# Patient Record
Sex: Female | Born: 1960 | Race: White | Hispanic: No | Marital: Single | Smoking: Current every day smoker
Health system: Southern US, Community
[De-identification: ages and names within clinical notes are randomized; demographics above are authoritative.]

## PROBLEM LIST (undated history)

## (undated) DIAGNOSIS — I10 Essential (primary) hypertension: Secondary | ICD-10-CM

## (undated) HISTORY — PX: ABDOMINAL HYSTERECTOMY: SHX81

## (undated) HISTORY — DX: Essential (primary) hypertension: I10

---

## 2003-08-02 ENCOUNTER — Other Ambulatory Visit: Payer: Self-pay

## 2006-11-11 ENCOUNTER — Emergency Department: Payer: Self-pay | Admitting: Unknown Physician Specialty

## 2007-10-06 ENCOUNTER — Emergency Department: Payer: Self-pay | Admitting: Emergency Medicine

## 2007-10-06 ENCOUNTER — Other Ambulatory Visit: Payer: Self-pay

## 2007-12-18 ENCOUNTER — Emergency Department: Payer: Self-pay | Admitting: Emergency Medicine

## 2008-01-29 ENCOUNTER — Ambulatory Visit: Payer: Self-pay | Admitting: Neurology

## 2008-05-25 IMAGING — CT CT ABD-PELV W/O CM
1 of 2 series · 16 of 32 positions shown, 20 images · non-contrast
Comparison: none

REASON FOR EXAM: Abdominal pain
COMMENTS:

PROCEDURE:     CT  - CT ABDOMEN AND PELVIS W[DATE]  [DATE]
RESULT:     The patient has a history of abdominal pain.
TECHNIQUE: Nonenhanced CT scan of abdomen and pelvis is obtained.

[Series 2: stone · axial · 0.62mm/px · z∈[-292,+82]mm · 16 of 135 slices shown, 20 images]
[im 5/135  soft-tissue]
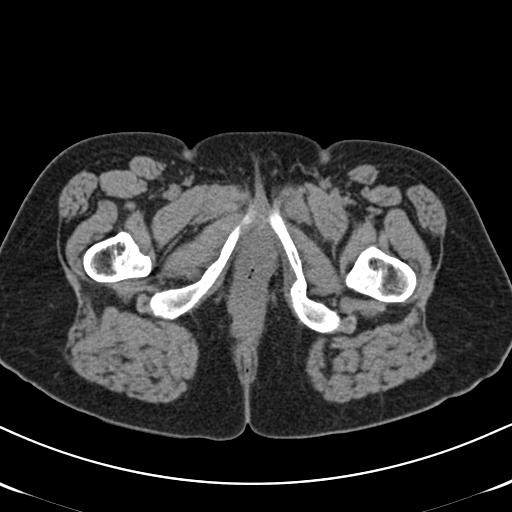
[im 5/135  bone]
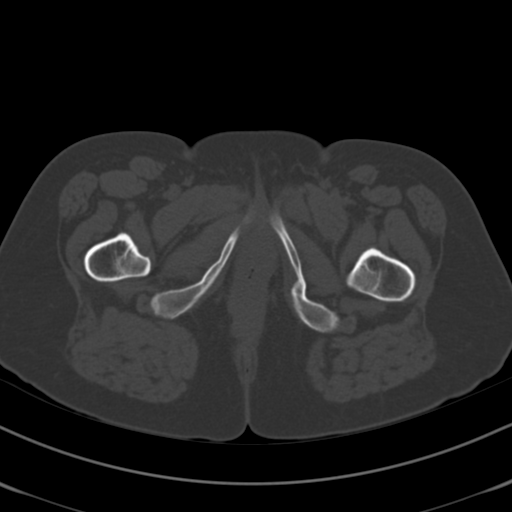
[im 15/135  soft-tissue]
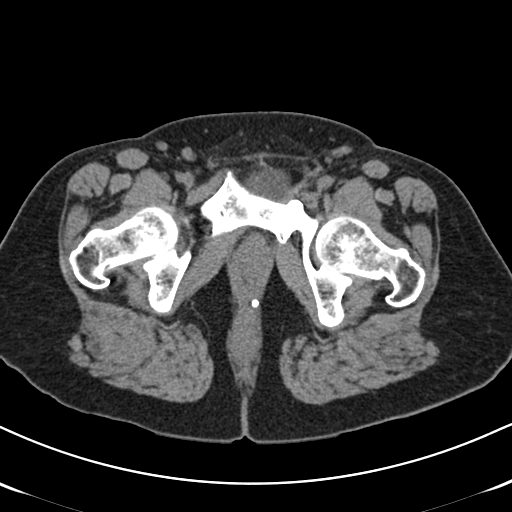
[im 24/135  soft-tissue]
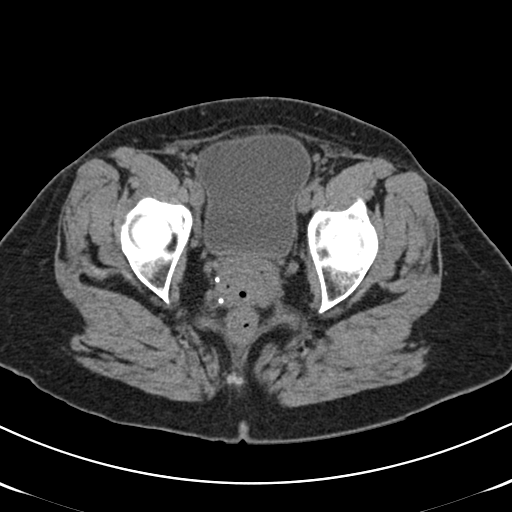
[im 34/135  soft-tissue]
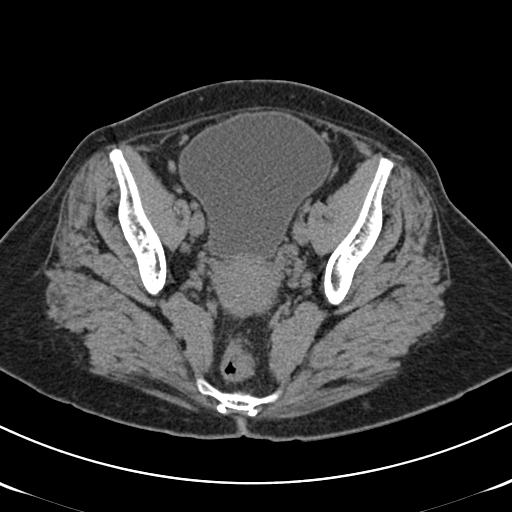
[im 44/135  soft-tissue]
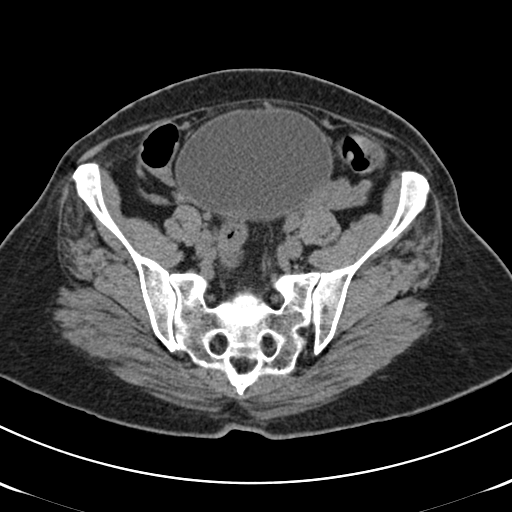
[im 53/135  soft-tissue]
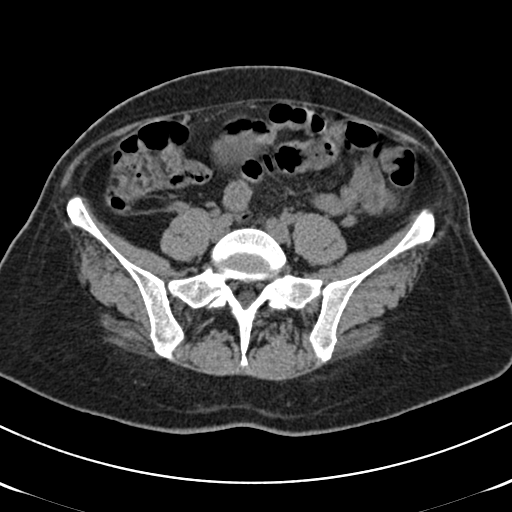
[im 63/135  soft-tissue]
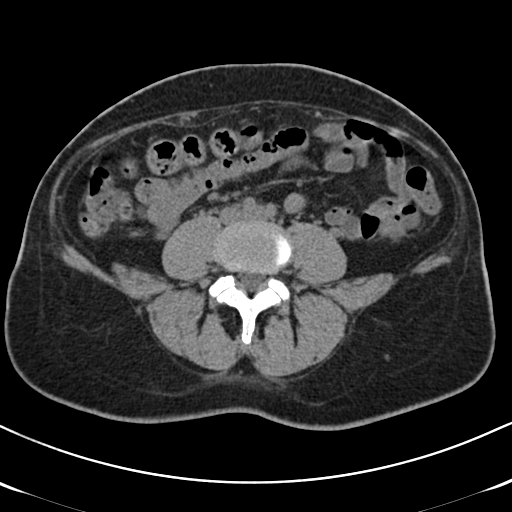
[im 72/135  soft-tissue]
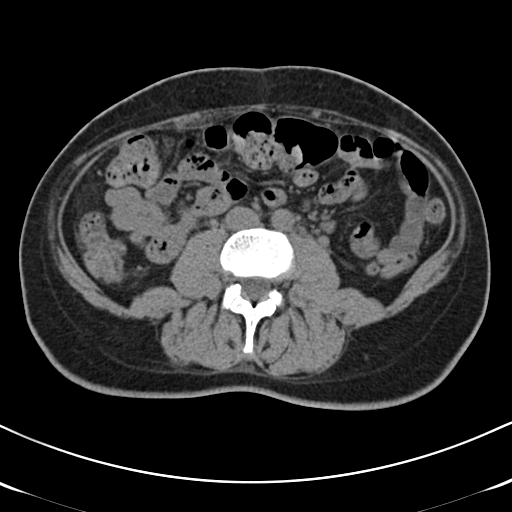
[im 82/135  soft-tissue]
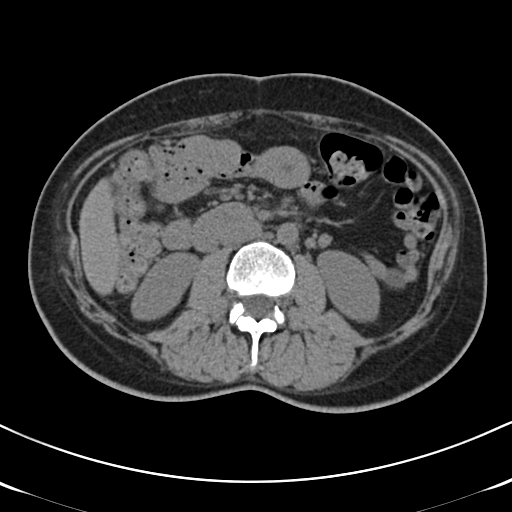
[im 82/135  bone]
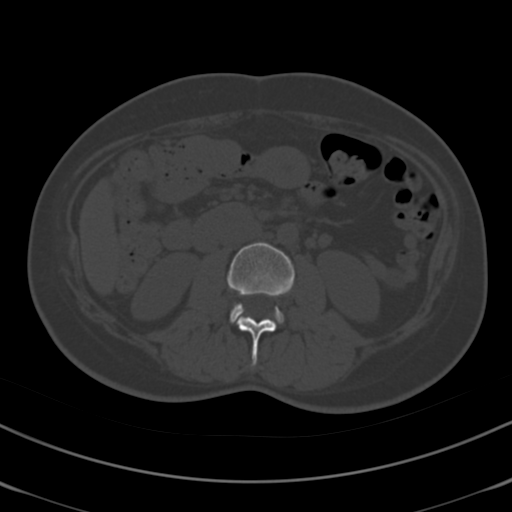
[im 91/135  soft-tissue]
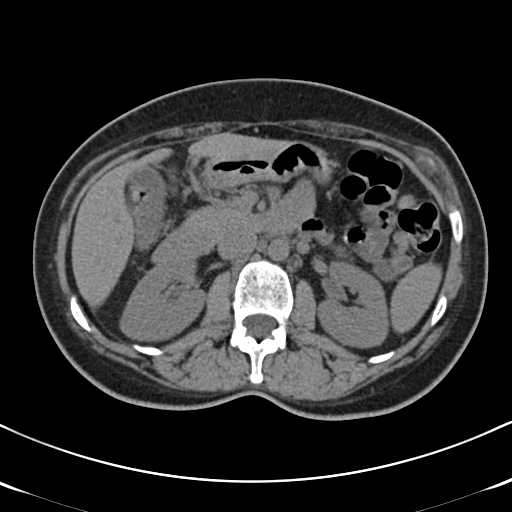
[im 101/135  soft-tissue]
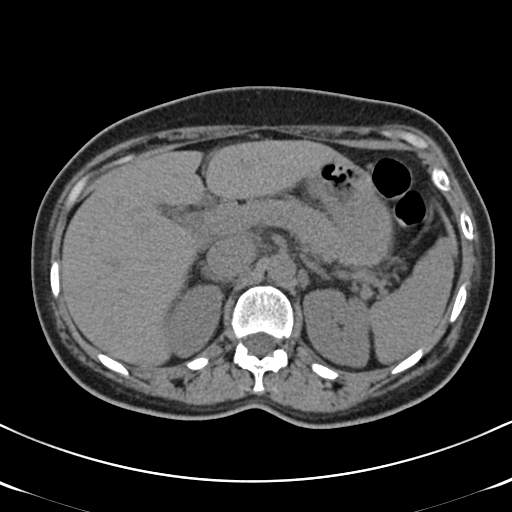
[im 111/135  soft-tissue]
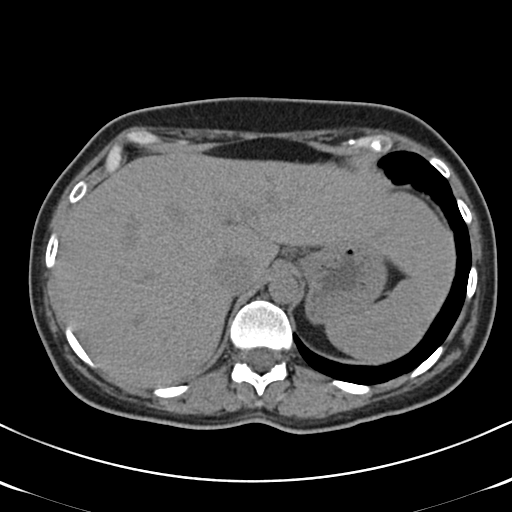
[im 115/135  lung]
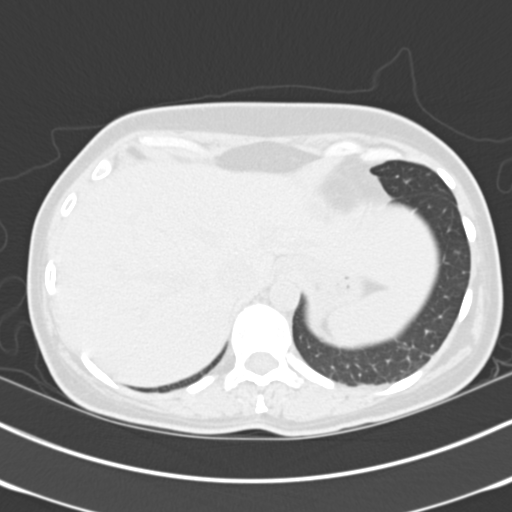
[im 120/135  soft-tissue]
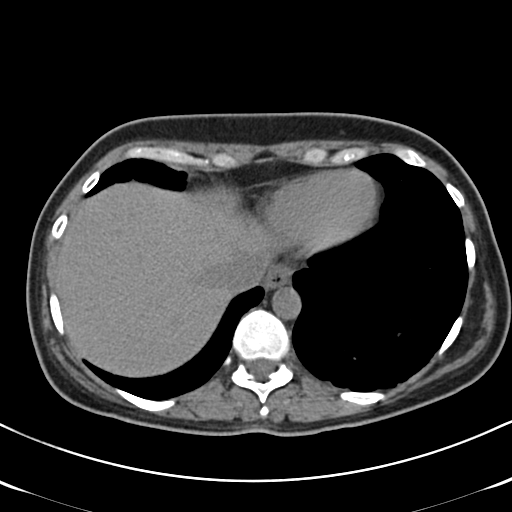
[im 120/135  lung]
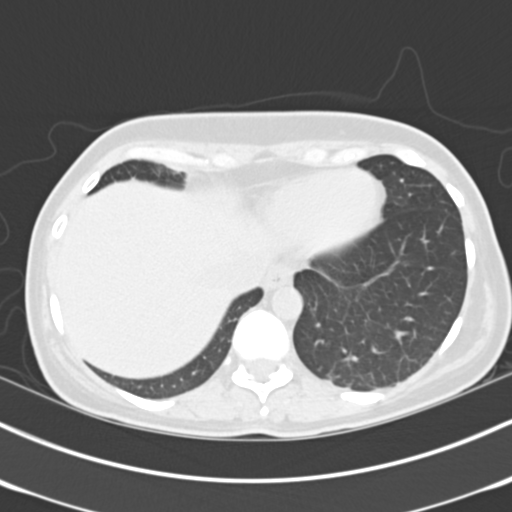
[im 125/135  lung]
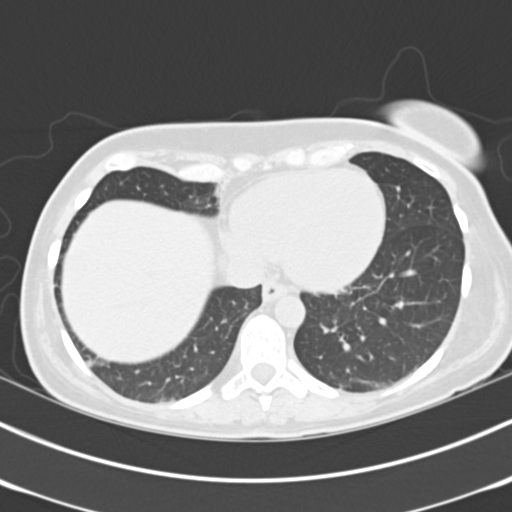
[im 130/135  soft-tissue]
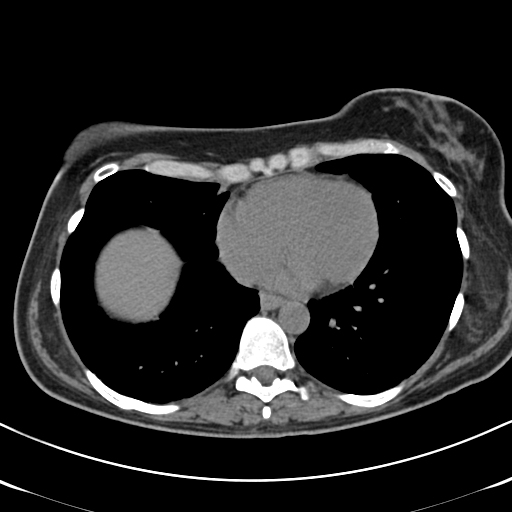
[im 130/135  lung]
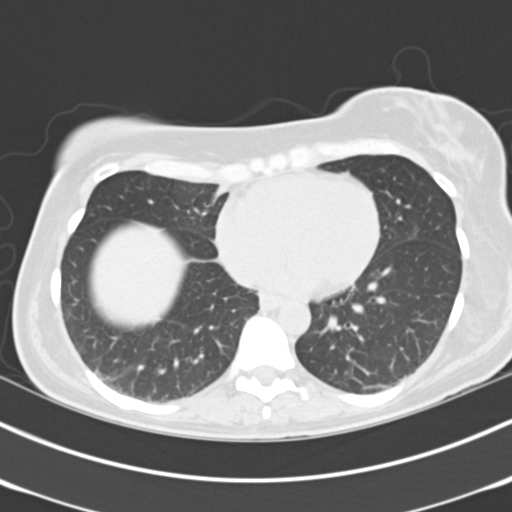

[16 of 32 positions shown; findings below may reference images not displayed]

FINDINGS: The liver and spleen are normal. The pancreas is normal. The
adrenals are normal. No focal renal abnormalities are identified. There is
no bowel distension. The appendix is normal. Small, inguinal lymph nodes are
noted. No pathologic pelvic fluid collections are identified. Deformity of
the pubic symphysis is noted consistent with prior trauma.
IMPRESSION: Nonspecific exam. Incidental note is made of shotty,
retroperitoneal lymph nodes. Also noted are small, inguinal lymph nodes.
Clinical correlation is suggested. If the patient has persistent symptoms,
then PET/CT can be performed to further evaluate these small lymph nodes.

## 2009-06-30 IMAGING — CT CT HEAD WITHOUT CONTRAST
2 series · 16 of 30 positions shown, 20 images · non-contrast
Comparison: none

REASON FOR EXAM: Headache and vertigo
COMMENTS:

PROCEDURE:     CT  - CT HEAD WITHOUT CONTRAST  - December 18, 2007  [DATE]
RESULT:     Comparison: No comparison
TECHNIQUE: Multiple axial images from the foramen magnum to the vertex were
obtained without IV contrast.

[Series 2: without · axial · non-contrast · 0.39mm/px · z∈[-158,-38]mm · 13 of 28 slices shown, 17 images]
[im 2/28  brain]
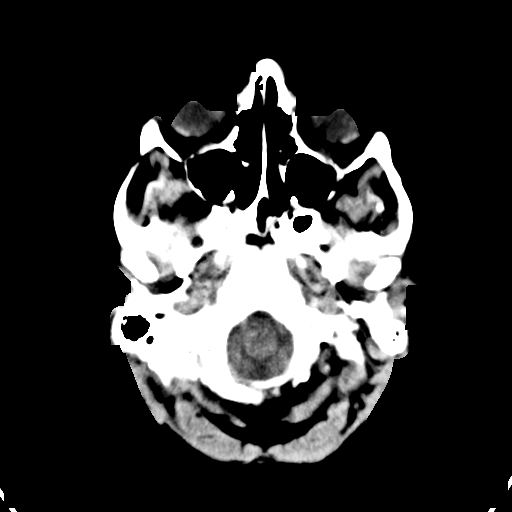
[im 2/28  bone]
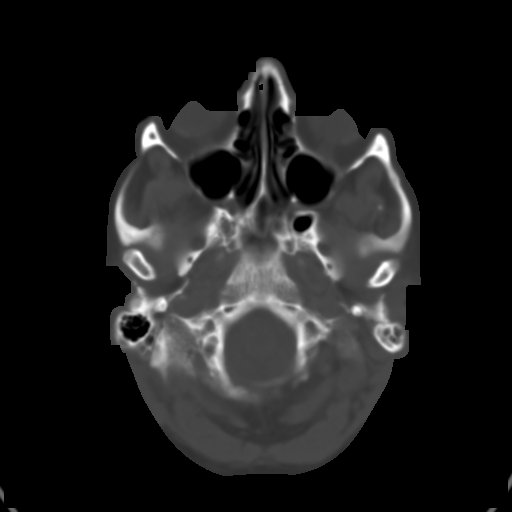
[im 4/28  brain]
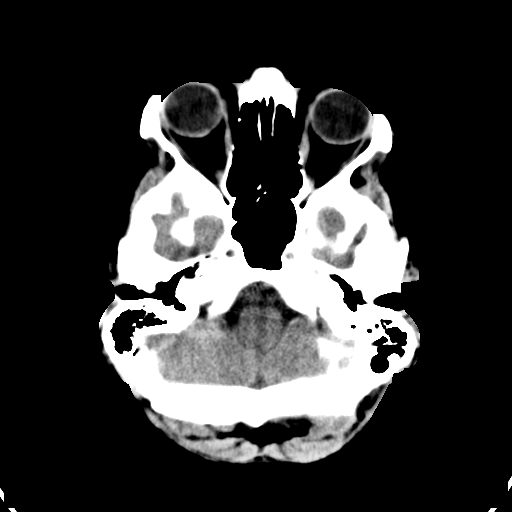
[im 6/28  brain]
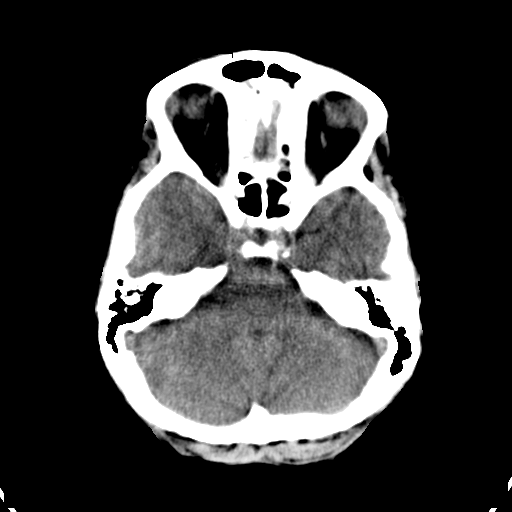
[im 8/28  brain]
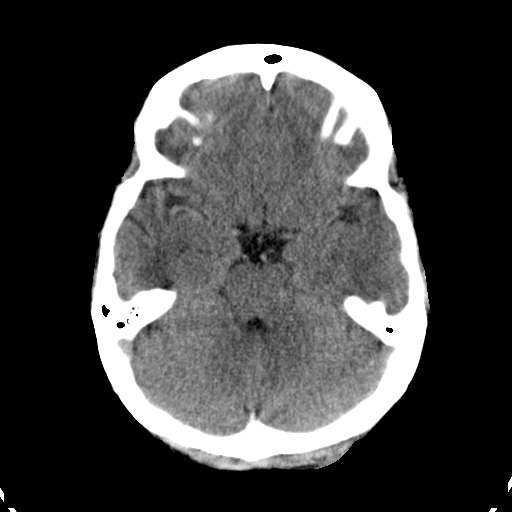
[im 10/28  brain]
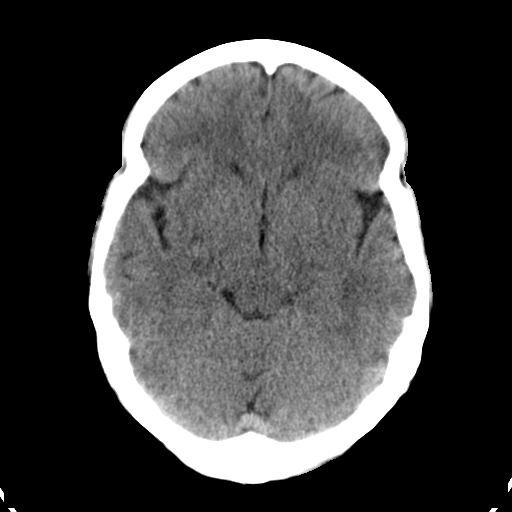
[im 10/28  bone]
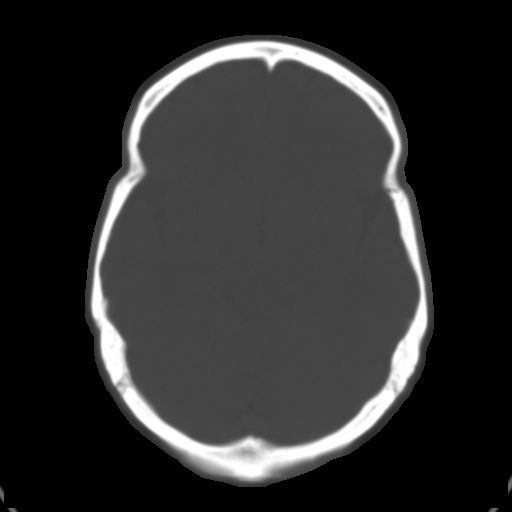
[im 12/28  brain]
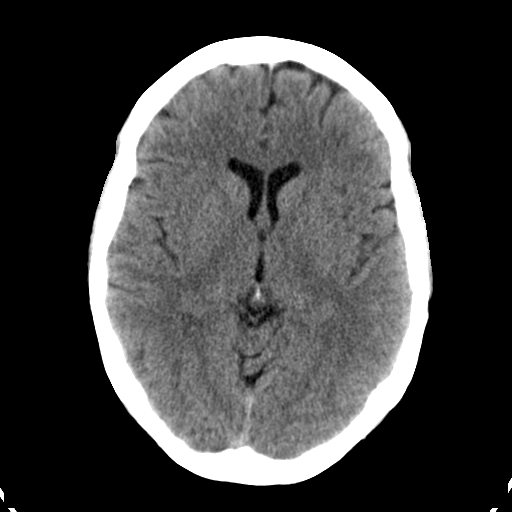
[im 14/28  brain]
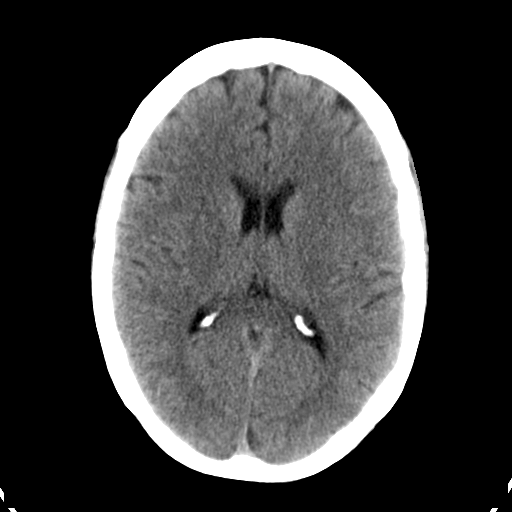
[im 16/28  brain]
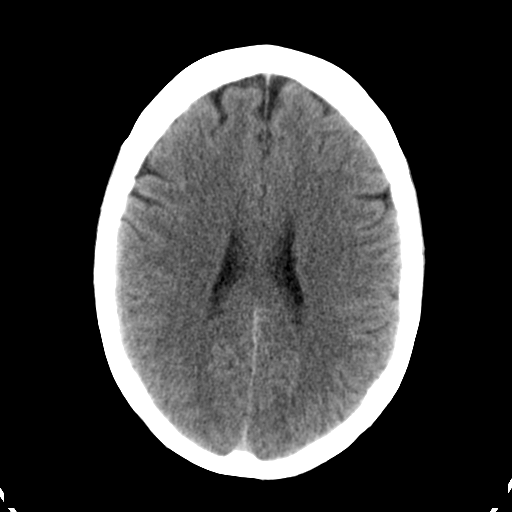
[im 18/28  brain]
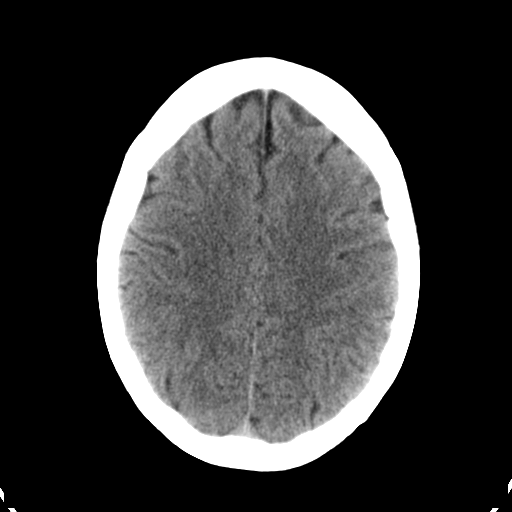
[im 18/28  bone]
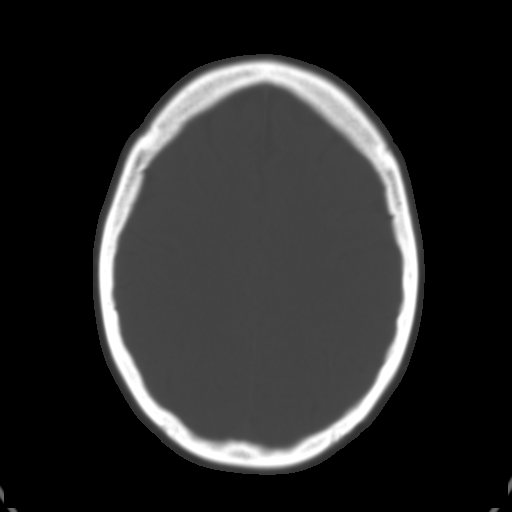
[im 20/28  brain]
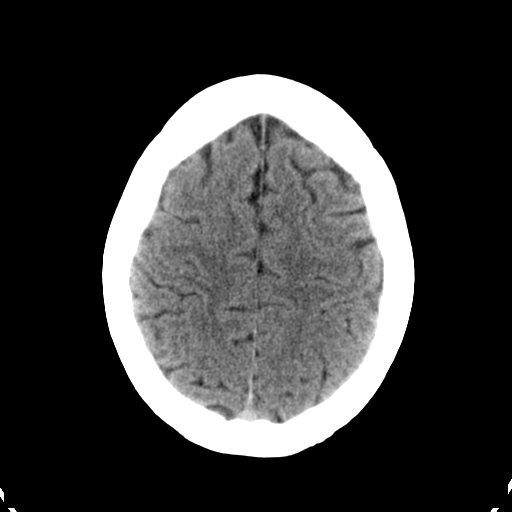
[im 22/28  brain]
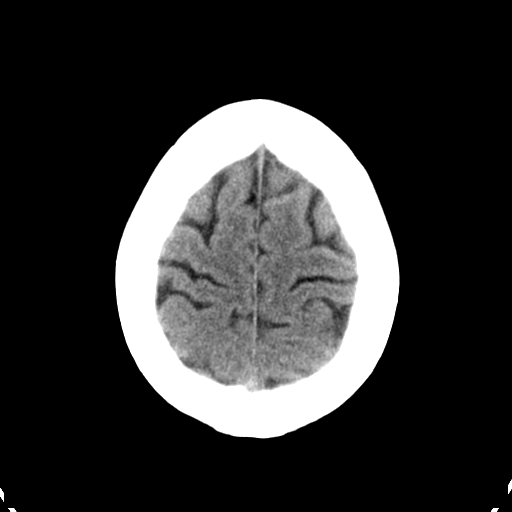
[im 24/28  brain]
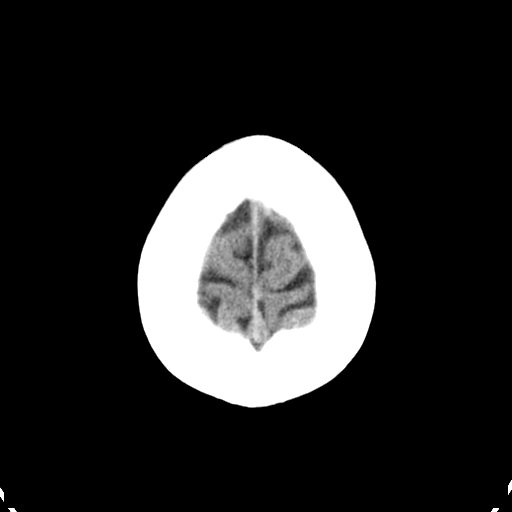
[im 26/28  brain]
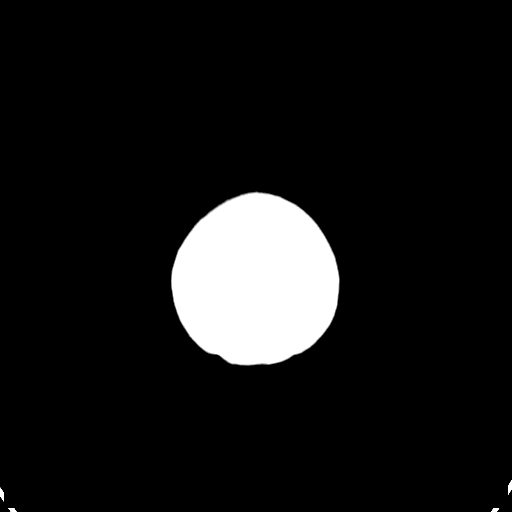
[im 26/28  bone]
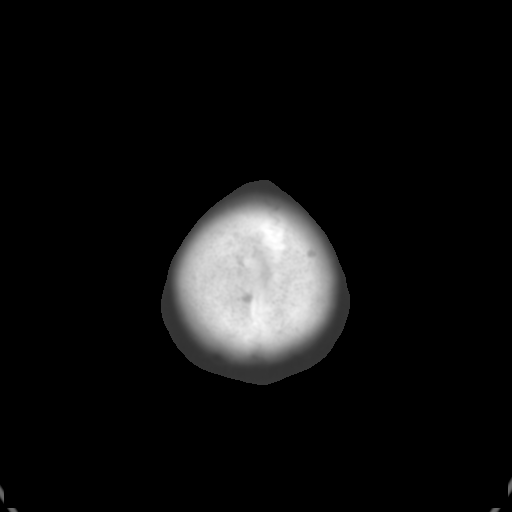

[Series 3: bone · axial · 0.39mm/px · z∈[-158,-118]mm · 3 of 28 slices shown]
[im 2/28  bone]
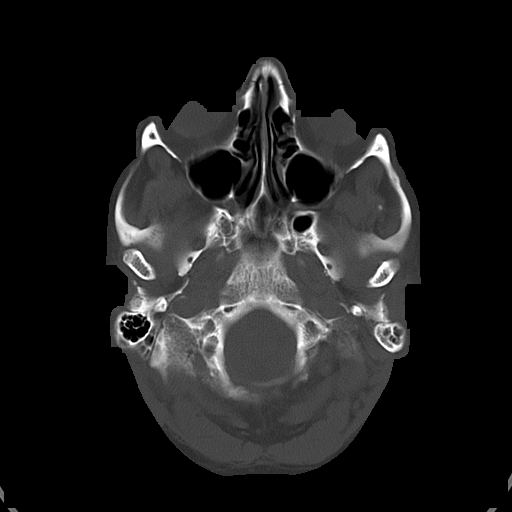
[im 6/28  bone]
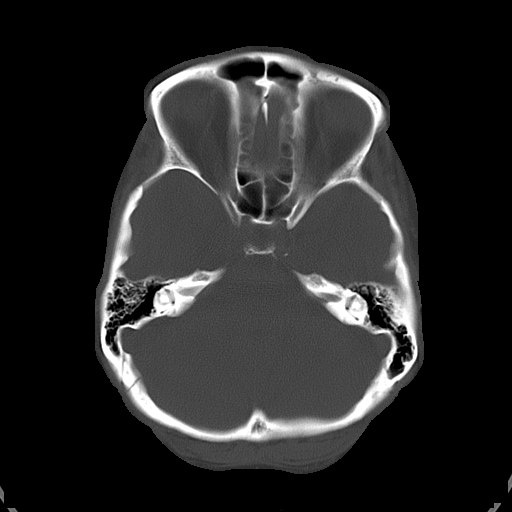
[im 10/28  bone]
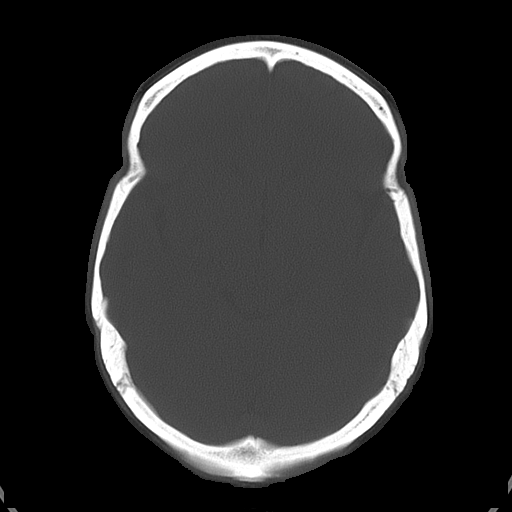

[16 of 30 positions shown; findings below may reference images not displayed]

FINDINGS: There is no evidence for mass effect, midline shift, or extra-axial fluid
collections.  There is no evidence for space-occupying lesion or
intracranial hemorrhage. There is no evidence for cortical-based area of
infarction.

Ventricles and sulci are appropriate for the patient's age. The basal
cisterns are patent.

Visualized portions of the orbits are unremarkable. The paranasal sinuses
and mastoid air cells are unremarkable.

The osseous structures are unremarkable.
IMPRESSION: No acute intracranial process.

## 2021-04-03 ENCOUNTER — Ambulatory Visit: Payer: Self-pay | Admitting: Internal Medicine

## 2021-04-11 ENCOUNTER — Encounter: Payer: Self-pay | Admitting: Internal Medicine

## 2021-04-11 ENCOUNTER — Ambulatory Visit (INDEPENDENT_AMBULATORY_CARE_PROVIDER_SITE_OTHER): Payer: Managed Care, Other (non HMO) | Admitting: Internal Medicine

## 2021-04-11 ENCOUNTER — Other Ambulatory Visit: Payer: Self-pay

## 2021-04-11 VITALS — BP 164/80 | HR 72 | Ht 59.0 in | Wt 111.9 lb

## 2021-04-11 DIAGNOSIS — G7100 Muscular dystrophy, unspecified: Secondary | ICD-10-CM

## 2021-04-11 DIAGNOSIS — I10 Essential (primary) hypertension: Secondary | ICD-10-CM | POA: Diagnosis not present

## 2021-04-11 NOTE — Progress Notes (Signed)
New Patient Office Visit  Subjective:  Patient ID: Abigail Wells, female    DOB: 1960/09/20  Age: 60 y.o. MRN: [id number]  CC:  Chief Complaint  Patient presents with   New Patient (Initial Visit)    Establishing care    Hypertension This is a chronic problem. The current episode started more than 1 year ago. The problem is controlled. Pertinent negatives include no anxiety, blurred vision, chest pain, headaches, malaise/fatigue, neck pain, orthopnea, palpitations, peripheral edema, PND or sweats. There are no known risk factors for coronary artery disease. Past treatments include angiotensin blockers.  Patient presents for hypertension  Past Medical History:  Diagnosis Date   Hypertension      Current Outpatient Medications:    cholecalciferol (VITAMIN D3) 25 MCG (1000 UNIT) tablet, Take 1,000 Units by mouth daily., Disp: , Rfl:    losartan-hydrochlorothiazide (HYZAAR) 50-12.5 MG tablet, Take 1 tablet by mouth daily., Disp: , Rfl:    Past Surgical History:  Procedure Laterality Date   ABDOMINAL HYSTERECTOMY      Family History  Problem Relation Age of Onset   Hypercholesterolemia Mother    Hypertension Mother    Stroke Mother    Early death Father    Hypercholesterolemia Sister    Hypertension Sister    Stroke Sister    Hypertension Son    Diabetes Son     Social History   Socioeconomic History   Marital status: Single    Spouse name: Not on file   Number of children: Not on file   Years of education: Not on file   Highest education level: Not on file  Occupational History   Not on file  Tobacco Use   Smoking status: Every Day    Packs/day: 0.50    Years: 38.00    Pack years: 19.00    Types: Cigarettes   Smokeless tobacco: Not on file  Vaping Use   Vaping Use: Every day  Substance and Sexual Activity   Alcohol use: Yes    Alcohol/week: 7.0 standard drinks    Types: 7 Cans of beer per week   Drug use: Never   Sexual activity: Not Currently     Birth control/protection: None  Other Topics Concern   Not on file  Social History Narrative   Not on file   Social Determinants of Health   Financial Resource Strain: Not on file  Food Insecurity: Not on file  Transportation Needs: Not on file  Physical Activity: Not on file  Stress: Not on file  Social Connections: Not on file  Intimate Partner Violence: Not on file    ROS Review of Systems  Constitutional: Negative.  Negative for malaise/fatigue.  HENT: Negative.    Eyes:  Negative for blurred vision and redness.  Respiratory:  Negative for apnea.   Cardiovascular:  Negative for chest pain, palpitations, orthopnea and PND.  Endocrine: Negative.   Genitourinary: Negative.   Musculoskeletal:  Negative for arthralgias and neck pain.  Neurological:  Negative for headaches.  Hematological: Negative.   Psychiatric/Behavioral: Negative.     Objective:   Today's Vitals: BP (!) 164/80   Pulse 72   Ht 4\' 11"  (1.499 m)   Wt 111 lb 14.4 oz (50.8 kg)   BMI 22.60 kg/m   Physical Exam Constitutional:      Appearance: Normal appearance.  HENT:     Head: Normocephalic and atraumatic.     Nose: Nose normal.  Cardiovascular:     Rate and Rhythm:  Normal rate.     Heart sounds: No murmur heard.   No gallop.  Abdominal:     General: There is no distension.  Musculoskeletal:        General: No swelling.     Cervical back: Normal range of motion.  Skin:    Coloration: Skin is not jaundiced.  Neurological:     General: No focal deficit present.  Psychiatric:        Mood and Affect: Mood normal.    Assessment & Plan:   Problem List Items Addressed This Visit       Cardiovascular and Mediastinum   Primary hypertension - Primary     Patient denies any chest pain or shortness of breath there is no history of palpitation or paroxysmal nocturnal dyspnea   patient was advised to follow low-salt low-cholesterol diet    ideally I want to keep systolic blood pressure below  130 mmHg, patient was asked to check blood pressure one times a week and give me a report on that.  Patient will be follow-up in 3 months  or earlier as needed, patient will call me back for any change in the cardiovascular symptoms Patient was advised to buy a book from local bookstore concerning blood pressure and read several chapters  every day.  This will be supplemented by some of the material we will give him from the office.  Patient should also utilize other resources like YouTube and Internet to learn more about the blood pressure and the diet.      Relevant Medications   losartan-hydrochlorothiazide (HYZAAR) 50-12.5 MG tablet   cholecalciferol (VITAMIN D3) 25 MCG (1000 UNIT) tablet     Musculoskeletal and Integument   Muscular dystrophy ([place])    Stable at the present time       Outpatient Encounter Medications as of 04/11/2021  Medication Sig   cholecalciferol (VITAMIN D3) 25 MCG (1000 UNIT) tablet Take 1,000 Units by mouth daily.   losartan-hydrochlorothiazide (HYZAAR) 50-12.5 MG tablet Take 1 tablet by mouth daily.   No facility-administered encounter medications on file as of 04/11/2021.    Follow-up: No follow-ups on file.   Cletis Athens, MD

## 2021-04-11 NOTE — Progress Notes (Signed)
Established Patient Office Visit  Subjective:  Patient ID: Abigail Wells, female    DOB: 1961-02-06  Age: 60 y.o. MRN: [id number]  CC:  Chief Complaint  Patient presents with   New Patient (Initial Visit)    Establishing care    HPI  KALEIAH KUTZER presents for hypertension  Past Medical History:  Diagnosis Date   Hypertension     Past Surgical History:  Procedure Laterality Date   ABDOMINAL HYSTERECTOMY      Family History  Problem Relation Age of Onset   Hypercholesterolemia Mother    Hypertension Mother    Stroke Mother    Early death Father    Hypercholesterolemia Sister    Hypertension Sister    Stroke Sister     Social History   Socioeconomic History   Marital status: Single    Spouse name: Not on file   Number of children: Not on file   Years of education: Not on file   Highest education level: Not on file  Occupational History   Not on file  Tobacco Use   Smoking status: Every Day    Packs/day: 0.50    Years: 38.00    Pack years: 19.00    Types: Cigarettes   Smokeless tobacco: Not on file  Vaping Use   Vaping Use: Every day  Substance and Sexual Activity   Alcohol use: Yes    Alcohol/week: 7.0 standard drinks    Types: 7 Cans of beer per week   Drug use: Never   Sexual activity: Not Currently    Birth control/protection: None  Other Topics Concern   Not on file  Social History Narrative   Not on file   Social Determinants of Health   Financial Resource Strain: Not on file  Food Insecurity: Not on file  Transportation Needs: Not on file  Physical Activity: Not on file  Stress: Not on file  Social Connections: Not on file  Intimate Partner Violence: Not on file     Current Outpatient Medications:    cholecalciferol (VITAMIN D3) 25 MCG (1000 UNIT) tablet, Take 1,000 Units by mouth daily., Disp: , Rfl:    losartan-hydrochlorothiazide (HYZAAR) 50-12.5 MG tablet, Take 1 tablet by mouth daily., Disp: , Rfl:    Allergies  Allergen  Reactions   Amoxicillin Swelling    ROS Review of Systems    Objective:    Physical Exam  There were no vitals taken for this visit. Wt Readings from Last 3 Encounters:  No data found for Wt     Health Maintenance Due  Topic Date Due   COVID-19 Vaccine (1) Never done   HIV Screening  Never done   Hepatitis C Screening  Never done   TETANUS/TDAP  Never done   PAP SMEAR-Modifier  Never done   COLONOSCOPY (Pts 45-46yrs Insurance coverage will need to be confirmed)  Never done   MAMMOGRAM  Never done   Zoster Vaccines- Shingrix (1 of 2) Never done   INFLUENZA VACCINE  Never done    There are no preventive care reminders to display for this patient.  No results found for: TSH No results found for: WBC, HGB, HCT, MCV, PLT No results found for: NA, K, CHLORIDE, CO2, GLUCOSE, BUN, CREATININE, BILITOT, ALKPHOS, AST, ALT, PROT, ALBUMIN, CALCIUM, ANIONGAP, EGFR, GFR No results found for: CHOL No results found for: HDL No results found for: LDLCALC No results found for: TRIG No results found for: CHOLHDL No results found for: XQJJ9E  Assessment & Plan:   Problem List Items Addressed This Visit   None   No orders of the defined types were placed in this encounter.   Follow-up: No follow-ups on file.    Cletis Athens, MD

## 2021-04-11 NOTE — Assessment & Plan Note (Signed)
Stable at the present time. 

## 2021-04-11 NOTE — Assessment & Plan Note (Signed)

## 2021-04-13 ENCOUNTER — Other Ambulatory Visit: Payer: Self-pay | Admitting: *Deleted

## 2021-04-13 DIAGNOSIS — I10 Essential (primary) hypertension: Secondary | ICD-10-CM

## 2021-04-13 MED ORDER — LOSARTAN POTASSIUM-HCTZ 50-12.5 MG PO TABS: 1.0000 | ORAL_TABLET | Freq: Every day | ORAL | 3 refills | Status: DC

## 2021-04-13 MED ORDER — VITAMIN D 25 MCG (1000 UNIT) PO TABS: 1000.0000 [IU] | ORAL_TABLET | Freq: Every day | ORAL | 3 refills | Status: AC

## 2021-06-07 ENCOUNTER — Other Ambulatory Visit: Payer: Self-pay

## 2021-06-07 ENCOUNTER — Ambulatory Visit (INDEPENDENT_AMBULATORY_CARE_PROVIDER_SITE_OTHER): Payer: Managed Care, Other (non HMO) | Admitting: *Deleted

## 2021-06-07 DIAGNOSIS — I10 Essential (primary) hypertension: Secondary | ICD-10-CM | POA: Diagnosis not present

## 2021-06-07 LAB — LIPID PANEL
Cholesterol: 185 mg/dL (ref <200)
HDL: 69 mg/dL (ref >=50)
LDL Cholesterol (Calc): 102 mg/dL (calc) — ABNORMAL HIGH
Non-HDL Cholesterol (Calc): 116 mg/dL (calc) (ref <130)
Total CHOL/HDL Ratio: 2.7 (calc) (ref <5.0)
Triglycerides: 56 mg/dL (ref <150)

## 2021-06-07 LAB — COMPLETE METABOLIC PANEL WITH GFR
AG Ratio: 1.6 (calc) (ref 1.0–2.5)
ALT: 13 U/L (ref 6–29)
AST: 26 U/L (ref 10–35)
Albumin: 4.4 g/dL (ref 3.6–5.1)
Alkaline phosphatase (APISO): 82 U/L (ref 37–153)
BUN: 9 mg/dL (ref 7–25)
CO2: 21 mmol/L (ref 20–32)
Calcium: 9.4 mg/dL (ref 8.6–10.4)
Chloride: 100 mmol/L (ref 98–110)
Creat: 0.54 mg/dL (ref 0.50–1.05)
Globulin: 2.7 g/dL (calc) (ref 1.9–3.7)
Glucose, Bld: 79 mg/dL (ref 65–99)
Potassium: 5.4 mmol/L — ABNORMAL HIGH (ref 3.5–5.3)
Sodium: 135 mmol/L (ref 135–146)
Total Bilirubin: 1.1 mg/dL (ref 0.2–1.2)
Total Protein: 7.1 g/dL (ref 6.1–8.1)
eGFR: 105 mL/min/{1.73_m2} (ref >=60)

## 2021-06-07 LAB — CBC WITH DIFFERENTIAL/PLATELET
Absolute Monocytes: 541 cells/uL (ref 200–950)
Basophils Absolute: 49 cells/uL (ref 0–200)
Basophils Relative: 0.6 %
Eosinophils Absolute: 98 cells/uL (ref 15–500)
Eosinophils Relative: 1.2 %
HCT: 51.2 % — ABNORMAL HIGH (ref 35.0–45.0)
Hemoglobin: 17.2 g/dL — ABNORMAL HIGH (ref 11.7–15.5)
Lymphs Abs: 2804 cells/uL (ref 850–3900)
MCH: 31.1 pg (ref 27.0–33.0)
MCHC: 33.6 g/dL (ref 32.0–36.0)
MCV: 92.6 fL (ref 80.0–100.0)
MPV: 9.8 fL (ref 7.5–12.5)
Monocytes Relative: 6.6 %
Neutro Abs: 4707 cells/uL (ref 1500–7800)
Neutrophils Relative %: 57.4 %
Platelets: 270 10*3/uL (ref 140–400)
RBC: 5.53 10*6/uL — ABNORMAL HIGH (ref 3.80–5.10)
RDW: 12.5 % (ref 11.0–15.0)
Total Lymphocyte: 34.2 %
WBC: 8.2 10*3/uL (ref 3.8–10.8)

## 2021-06-07 LAB — TSH: TSH: 1.7 mIU/L (ref 0.40–4.50)

## 2021-06-13 ENCOUNTER — Other Ambulatory Visit: Payer: Self-pay

## 2021-06-13 ENCOUNTER — Ambulatory Visit (INDEPENDENT_AMBULATORY_CARE_PROVIDER_SITE_OTHER): Payer: Managed Care, Other (non HMO) | Admitting: Internal Medicine

## 2021-06-13 ENCOUNTER — Encounter: Payer: Self-pay | Admitting: Internal Medicine

## 2021-06-13 VITALS — BP 135/74 | HR 88 | Ht 59.0 in | Wt 115.1 lb

## 2021-06-13 DIAGNOSIS — Z1211 Encounter for screening for malignant neoplasm of colon: Secondary | ICD-10-CM | POA: Diagnosis not present

## 2021-06-13 DIAGNOSIS — I10 Essential (primary) hypertension: Secondary | ICD-10-CM

## 2021-06-13 DIAGNOSIS — G7100 Muscular dystrophy, unspecified: Secondary | ICD-10-CM | POA: Diagnosis not present

## 2021-06-13 NOTE — Assessment & Plan Note (Signed)
Blood pressure is stable on medications

## 2021-06-13 NOTE — Assessment & Plan Note (Signed)
Stable at the present time, we will schedule the patient for colonoscopy

## 2021-06-13 NOTE — Progress Notes (Signed)
Established Patient Office Visit  Subjective:  Patient ID: Abigail Wells, female    DOB: October 27, 1960  Age: 61 y.o. MRN: [id number]  CC:  Chief Complaint  Patient presents with   Hypertension    Patient here today to follow up on hypertension.     Hypertension   Loran Senters presents for bp check  Past Medical History:  Diagnosis Date   Hypertension     Past Surgical History:  Procedure Laterality Date   ABDOMINAL HYSTERECTOMY      Family History  Problem Relation Age of Onset   Hypercholesterolemia Mother    Hypertension Mother    Stroke Mother    Early death Father    Hypercholesterolemia Sister    Hypertension Sister    Stroke Sister    Hypertension Son    Diabetes Son     Social History   Socioeconomic History   Marital status: Single    Spouse name: Not on file   Number of children: Not on file   Years of education: Not on file   Highest education level: Not on file  Occupational History   Not on file  Tobacco Use   Smoking status: Every Day    Packs/day: 0.50    Years: 38.00    Pack years: 19.00    Types: Cigarettes   Smokeless tobacco: Not on file  Vaping Use   Vaping Use: Every day  Substance and Sexual Activity   Alcohol use: Yes    Alcohol/week: 7.0 standard drinks    Types: 7 Cans of beer per week   Drug use: Never   Sexual activity: Not Currently    Birth control/protection: None  Other Topics Concern   Not on file  Social History Narrative   Not on file   Social Determinants of Health   Financial Resource Strain: Not on file  Food Insecurity: Not on file  Transportation Needs: Not on file  Physical Activity: Not on file  Stress: Not on file  Social Connections: Not on file  Intimate Partner Violence: Not on file     Current Outpatient Medications:    cholecalciferol (VITAMIN D3) 25 MCG (1000 UNIT) tablet, Take 1 tablet (1,000 Units total) by mouth daily., Disp: 90 tablet, Rfl: 3   losartan-hydrochlorothiazide (HYZAAR)  50-12.5 MG tablet, Take 1 tablet by mouth daily., Disp: 90 tablet, Rfl: 3   Allergies  Allergen Reactions   Amoxicillin Swelling    ROS Review of Systems  Constitutional: Negative.   HENT: Negative.    Eyes: Negative.   Respiratory: Negative.    Cardiovascular: Negative.   Gastrointestinal: Negative.   Endocrine: Negative.   Genitourinary: Negative.   Musculoskeletal: Negative.   Skin: Negative.   Allergic/Immunologic: Negative.   Neurological: Negative.   Hematological: Negative.   Psychiatric/Behavioral: Negative.    All other systems reviewed and are negative.    Objective:    Physical Exam Vitals reviewed.  Constitutional:      Appearance: Normal appearance.  HENT:     Mouth/Throat:     Mouth: Mucous membranes are moist.  Eyes:     Pupils: Pupils are equal, round, and reactive to light.  Neck:     Vascular: No carotid bruit.  Cardiovascular:     Rate and Rhythm: Normal rate and regular rhythm.     Pulses: Normal pulses.     Heart sounds: Normal heart sounds.  Pulmonary:     Effort: Pulmonary effort is normal.  Breath sounds: Normal breath sounds.  Abdominal:     General: Bowel sounds are normal.     Palpations: Abdomen is soft. There is no hepatomegaly, splenomegaly or mass.     Tenderness: There is no abdominal tenderness.     Hernia: No hernia is present.  Musculoskeletal:        General: No tenderness.     Cervical back: Neck supple.     Right lower leg: No edema.     Left lower leg: No edema.  Skin:    Findings: No rash.  Neurological:     Mental Status: She is alert and oriented to person, place, and time.     Motor: No weakness.  Psychiatric:        Mood and Affect: Mood and affect normal.        Behavior: Behavior normal.    BP 135/74    Pulse 88    Ht _0  (1.499 m)    Wt 115 lb 1.6 oz (52.2 kg)    BMI 23.25 kg/m  Wt Readings from Last 3 Encounters:  06/13/21 115 lb 1.6 oz (52.2 kg)  04/11/21 111 lb 14.4 oz (50.8 kg)      Health Maintenance Due  Topic Date Due   COVID-19 Vaccine (1) Never done   HIV Screening  Never done   Hepatitis C Screening  Never done   TETANUS/TDAP  Never done   PAP SMEAR-Modifier  Never done   COLONOSCOPY (Pts 45-32yr Insurance coverage will need to be confirmed)  Never done   MAMMOGRAM  Never done   Zoster Vaccines- Shingrix (1 of 2) Never done   INFLUENZA VACCINE  Never done    There are no preventive care reminders to display for this patient.  Lab Results  Component Value Date   TSH 1.70 06/07/2021   Lab Results  Component Value Date   WBC 8.2 06/07/2021   HGB 17.2 (H) 06/07/2021   HCT 51.2 (H) 06/07/2021   MCV 92.6 06/07/2021   PLT 270 06/07/2021   Lab Results  Component Value Date   NA 135 06/07/2021   K 5.4 (H) 06/07/2021   CO2 21 06/07/2021   GLUCOSE 79 06/07/2021   BUN 9 06/07/2021   CREATININE 0.54 06/07/2021   BILITOT 1.1 06/07/2021   AST 26 06/07/2021   ALT 13 06/07/2021   PROT 7.1 06/07/2021   CALCIUM 9.4 06/07/2021   EGFR 105 06/07/2021   Lab Results  Component Value Date   CHOL 185 06/07/2021   Lab Results  Component Value Date   HDL 69 06/07/2021   Lab Results  Component Value Date   LDLCALC 102 (H) 06/07/2021   Lab Results  Component Value Date   TRIG 56 06/07/2021   Lab Results  Component Value Date   CHOLHDL 2.7 06/07/2021   No results found for: HGBA1C    Assessment & Plan:   Problem List Items Addressed This Visit       Cardiovascular and Mediastinum   Primary hypertension    Blood pressure is stable on medications        Musculoskeletal and Integument   Muscular dystrophy ([place]    Stable at the present time, we will schedule the patient for colonoscopy       No orders of the defined types were placed in this encounter.   Follow-up: No follow-ups on file.    JCletis Athens MD

## 2021-06-14 ENCOUNTER — Telehealth: Payer: Self-pay

## 2021-06-14 NOTE — Telephone Encounter (Signed)
CALLED PATIENT NO ANSWER LEFT VOICEMAIL FOR A CALL BACK ? ?

## 2021-06-14 NOTE — Addendum Note (Signed)
Addended by: Lacretia Nicks L on: 06/14/2021 10:14 AM   Modules accepted: Orders

## 2021-06-15 ENCOUNTER — Other Ambulatory Visit: Payer: Self-pay

## 2021-06-15 ENCOUNTER — Telehealth: Payer: Self-pay

## 2021-06-15 NOTE — Telephone Encounter (Signed)
Patient having abdominal pain all the time so scheduled in person visit for 09/14/2021

## 2021-06-15 NOTE — Progress Notes (Unsigned)
Gastroenterology Pre-Procedure Review  Request Date: *** Requesting Physician: Dr. Marland Kitchen  PATIENT REVIEW QUESTIONS: The patient responded to the following health history questions as indicated:    1. Are you having any GI issues? {Yes/No:19989} 2. Do you have a personal history of Polyps? {Yes/No:19989} 3. Do you have a family history of Colon Cancer or Polyps? {Yes/No:19989} 4. Diabetes Mellitus? {Yes/No:19989} 5. Joint replacements in the past 12 months?{Yes/No:19989} 6. Major health problems in the past 3 months?{Yes/No:19989} 7. Any artificial heart valves, MVP, or defibrillator?{Yes/No:19989}    MEDICATIONS & ALLERGIES:    Patient reports the following regarding taking any anticoagulation/antiplatelet therapy:   Plavix, Coumadin, Eliquis, Xarelto, Lovenox, Pradaxa, Brilinta, or Effient? {Yes/No:19989} Aspirin? {Yes/No:19989}  Patient confirms/reports the following medications:  Current Outpatient Medications  Medication Sig Dispense Refill   cholecalciferol (VITAMIN D3) 25 MCG (1000 UNIT) tablet Take 1 tablet (1,000 Units total) by mouth daily. 90 tablet 3   losartan-hydrochlorothiazide (HYZAAR) 50-12.5 MG tablet Take 1 tablet by mouth daily. 90 tablet 3   No current facility-administered medications for this visit.    Patient confirms/reports the following allergies:  Allergies  Allergen Reactions   Amoxicillin Swelling   Lisinopril Other (See Comments)    Other Reaction: EXTREME FATIGUE    No orders of the defined types were placed in this encounter.   AUTHORIZATION INFORMATION Primary Insurance: 1D#: Group #:  Secondary Insurance: 1D#: Group #:  SCHEDULE INFORMATION: Date:  Time: Location:

## 2021-09-12 ENCOUNTER — Ambulatory Visit: Payer: Managed Care, Other (non HMO) | Admitting: Internal Medicine

## 2021-09-14 ENCOUNTER — Ambulatory Visit (INDEPENDENT_AMBULATORY_CARE_PROVIDER_SITE_OTHER): Payer: Managed Care, Other (non HMO) | Admitting: Gastroenterology

## 2021-09-14 ENCOUNTER — Encounter: Payer: Self-pay | Admitting: Gastroenterology

## 2021-09-14 ENCOUNTER — Other Ambulatory Visit: Payer: Self-pay

## 2021-09-14 VITALS — BP 158/84 | HR 75 | Temp 98.2°F | Ht 59.0 in | Wt 115.1 lb

## 2021-09-14 DIAGNOSIS — R1084 Generalized abdominal pain: Secondary | ICD-10-CM | POA: Diagnosis not present

## 2021-09-14 DIAGNOSIS — K529 Noninfective gastroenteritis and colitis, unspecified: Secondary | ICD-10-CM

## 2021-09-14 MED ORDER — NA SULFATE-K SULFATE-MG SULF 17.5-3.13-1.6 GM/177ML PO SOLN: 354.0000 mL | Freq: Once | ORAL | 0 refills | Status: AC

## 2021-09-14 NOTE — Progress Notes (Signed)
?  ?Cephas Darby, MD ?[address]  ?[address]  ?[place], [place] [address]  ?Main: [phone]  ?Fax: [phone] ? ? ? ?Gastroenterology Consultation ? ?Referring Provider:     Cletis Athens, MD ?Primary Care Physician:  Cletis Athens, MD ?Primary Gastroenterologist:  Dr. Cephas Darby ?Reason for Consultation:     Chronic abdominal pain, chronic diarrhea ?      ? HPI:   ?Abigail Wells is a 61 y.o. female referred by Dr. Cletis Athens, MD  for consultation & management of chronic abdominal pain and diarrhea.  Patient reports that she has been experiencing generalized abdominal pain for several years associated with chronic nonbloody diarrhea, she generally has bowel movements 5 to 6/day, not associated with any urgency or incontinence.  She denies any nocturnal diarrhea.  She reports that her weight has been stable.  She does have abdominal bloating.  She has history of heavy tobacco use.  Her labs are unremarkable other than erythrocytosis.  Patient was told that she has irritable bowel syndrome based on her GI symptoms ? ?NSAIDs: None ? ?Antiplts/Anticoagulants/Anti thrombotics: None ? ?GI Procedures: None ?She denies family history of IBD, celiac disease, GI malignancy ? ?Past Medical History:  ?Diagnosis Date  ? Hypertension   ? ? ?Past Surgical History:  ?Procedure Laterality Date  ? ABDOMINAL HYSTERECTOMY    ? ? ?Current Outpatient Medications:  ?  cholecalciferol (VITAMIN D3) 25 MCG (1000 UNIT) tablet, Take 1 tablet (1,000 Units total) by mouth daily., Disp: 90 tablet, Rfl: 3 ?  losartan-hydrochlorothiazide (HYZAAR) 50-12.5 MG tablet, Take 1 tablet by mouth daily., Disp: 90 tablet, Rfl: 3 ?  Na Sulfate-K Sulfate-Mg Sulf 17.5-3.13-1.6 GM/177ML SOLN, Take 354 mLs by mouth once for 1 dose., Disp: 354 mL, Rfl: 0 ? ? ? ?Family History  ?Problem Relation Age of Onset  ? Hypercholesterolemia Mother   ? Hypertension Mother   ? Stroke Mother   ? Early death Father   ? Hypercholesterolemia Sister   ?  Hypertension Sister   ? Stroke Sister   ? Hypertension Son   ? Diabetes Son   ?  ? ?Social History  ? ?Tobacco Use  ? Smoking status: Every Day  ?  Packs/day: 0.50  ?  Years: 38.00  ?  Pack years: 19.00  ?  Types: Cigarettes  ?Vaping Use  ? Vaping Use: Every day  ?Substance Use Topics  ? Alcohol use: Yes  ?  Alcohol/week: 7.0 standard drinks  ?  Types: 7 Cans of beer per week  ? Drug use: Never  ? ? ?Allergies as of 09/14/2021 - Review Complete 09/14/2021  ?Allergen Reaction Noted  ? Amoxicillin Swelling 04/11/2021  ? Lisinopril Other (See Comments) 06/15/2021  ? Penicillins Other (See Comments) 09/12/2021  ? ? ?Review of Systems:    ?All systems reviewed and negative except where noted in HPI. ? ? Physical Exam:  ?BP (!) 158/84 (BP Location: Left Arm, Patient Position: Sitting, Cuff Size: Normal)   Pulse 75   Temp 98.2 ?F (36.8 ?C) (Oral)   Ht '4\' 11"'$  (1.499 m)   Wt 115 lb 2 oz (52.2 kg)   BMI 23.25 kg/m?  ?No LMP recorded. ? ?General:   Alert,  Well-developed, well-nourished, pleasant and cooperative in NAD ?Head:  Normocephalic and atraumatic. ?Eyes:  Sclera clear, no icterus.   Conjunctiva pink. ?Ears:  Normal auditory acuity. ?Nose:  No deformity, discharge, or lesions. ?Mouth:  No deformity or lesions,oropharynx pink & moist. ?Neck:  Supple;  no masses or thyromegaly. ?Lungs:  Respirations even and unlabored.  Clear throughout to auscultation.   No wheezes, crackles, or rhonchi. No acute distress. ?Heart:  Regular rate and rhythm; no murmurs, clicks, rubs, or gallops. ?Abdomen:  Normal bowel sounds. Soft, non-tender and mildly distended, tympanic to percussion without masses, hepatosplenomegaly or hernias noted.  No guarding or rebound tenderness.   ?Rectal: Not performed ?Msk:  Symmetrical without gross deformities. Good, equal movement & strength bilaterally. ?Pulses:  Normal pulses noted. ?Extremities:  No clubbing or edema.  No cyanosis. ?Neurologic:  Alert and oriented x3;  grossly normal  neurologically. ?Skin:  Intact without significant lesions or rashes. No jaundice. ?Psych:  Alert and cooperative. Normal mood and affect. ? ?Imaging Studies: ?No recent abdominal imaging ? ?Assessment and Plan:  ? ?AYAANA BIONDO is a 61 y.o. female with history of heavy tobacco use, chronic alcohol use, 1-2 beers per night is seen in consultation for generalized abdominal pain associated with chronic nonbloody diarrhea without any weight loss, abdominal bloating ?Recommend upper endoscopy with gastric, duodenal biopsies and colonoscopy with possible TI evaluation and colon biopsies for further evaluation. ?If above work-up is unremarkable, recommend to check pancreatic fecal elastase levels, empirically treat for bacterial overgrowth ? ?Follow up in 4 to 5 months ? ? ?Cephas Darby, MD ? ?

## 2021-10-24 ENCOUNTER — Ambulatory Visit: Payer: Managed Care, Other (non HMO) | Admitting: Anesthesiology

## 2021-10-24 ENCOUNTER — Ambulatory Visit
Admission: RE | Admit: 2021-10-24 | Discharge: 2021-10-24 | Disposition: A | Discharge status: Home / Self Care | Payer: Managed Care, Other (non HMO) | Attending: Gastroenterology | Admitting: Gastroenterology

## 2021-10-24 ENCOUNTER — Encounter
Admission: RE | Disposition: A | Discharge status: Home / Self Care | Payer: Self-pay | Source: Home / Self Care | Attending: Gastroenterology

## 2021-10-24 DIAGNOSIS — I10 Essential (primary) hypertension: Secondary | ICD-10-CM | POA: Insufficient documentation

## 2021-10-24 DIAGNOSIS — D125 Benign neoplasm of sigmoid colon: Secondary | ICD-10-CM | POA: Insufficient documentation

## 2021-10-24 DIAGNOSIS — K3189 Other diseases of stomach and duodenum: Secondary | ICD-10-CM | POA: Insufficient documentation

## 2021-10-24 DIAGNOSIS — D128 Benign neoplasm of rectum: Secondary | ICD-10-CM | POA: Diagnosis not present

## 2021-10-24 DIAGNOSIS — K621 Rectal polyp: Secondary | ICD-10-CM | POA: Diagnosis not present

## 2021-10-24 DIAGNOSIS — D123 Benign neoplasm of transverse colon: Secondary | ICD-10-CM | POA: Diagnosis not present

## 2021-10-24 DIAGNOSIS — K319 Disease of stomach and duodenum, unspecified: Secondary | ICD-10-CM

## 2021-10-24 DIAGNOSIS — K644 Residual hemorrhoidal skin tags: Secondary | ICD-10-CM | POA: Insufficient documentation

## 2021-10-24 DIAGNOSIS — Z79899 Other long term (current) drug therapy: Secondary | ICD-10-CM | POA: Diagnosis not present

## 2021-10-24 DIAGNOSIS — K529 Noninfective gastroenteritis and colitis, unspecified: Secondary | ICD-10-CM | POA: Diagnosis present

## 2021-10-24 DIAGNOSIS — K635 Polyp of colon: Secondary | ICD-10-CM | POA: Diagnosis not present

## 2021-10-24 DIAGNOSIS — F17210 Nicotine dependence, cigarettes, uncomplicated: Secondary | ICD-10-CM | POA: Insufficient documentation

## 2021-10-24 HISTORY — PX: COLONOSCOPY WITH PROPOFOL: SHX5780

## 2021-10-24 HISTORY — PX: ESOPHAGOGASTRODUODENOSCOPY (EGD) WITH PROPOFOL: SHX5813

## 2021-10-24 SURGERY — COLONOSCOPY WITH PROPOFOL
Anesthesia: General

## 2021-10-24 MED ORDER — LIDOCAINE HCL (CARDIAC) PF 100 MG/5ML IV SOSY
PREFILLED_SYRINGE | INTRAVENOUS | Status: DC | PRN
  Administered 2021-10-24: 50 mg via INTRAVENOUS

## 2021-10-24 MED ORDER — SODIUM CHLORIDE 0.9 % IV SOLN: INTRAVENOUS | Status: DC

## 2021-10-24 MED ORDER — PROPOFOL 1000 MG/100ML IV EMUL
INTRAVENOUS | Status: AC
  Filled 2021-10-24: qty 100

## 2021-10-24 MED ORDER — EPHEDRINE SULFATE (PRESSORS) 50 MG/ML IJ SOLN
INTRAMUSCULAR | Status: DC | PRN
  Administered 2021-10-24: 5 mg via INTRAVENOUS

## 2021-10-24 MED ORDER — EPHEDRINE 5 MG/ML INJ
INTRAVENOUS | Status: AC
  Filled 2021-10-24: qty 10

## 2021-10-24 MED ORDER — PROPOFOL 500 MG/50ML IV EMUL
INTRAVENOUS | Status: DC | PRN
  Administered 2021-10-24: 150 ug/kg/min via INTRAVENOUS

## 2021-10-24 MED ORDER — PROPOFOL 10 MG/ML IV BOLUS
INTRAVENOUS | Status: DC | PRN
  Administered 2021-10-24: 80 mg via INTRAVENOUS

## 2021-10-24 NOTE — Op Note (Signed)
[facility] Gastroenterology Patient Name: Abigail Wells Procedure Date: 10/24/2021 8:20 AM MRN: [id number] Account #: [id number] Date of Birth: [birth year] Admit Type: Outpatient Age: 61 Room: [facility] ENDO ROOM 3 Gender: Female Note Status: Finalized Instrument Name: Colonscope [id number] Procedure:             Colonoscopy Indications:           This is the patient's first colonoscopy, Chronic                         diarrhea Providers:             Lin Landsman MD, MD Referring MD:          Cletis Athens, MD (Referring MD) Medicines:             General Anesthesia Complications:         No immediate complications. Estimated blood loss: None. Procedure:             Pre-Anesthesia Assessment:                        - Prior to the procedure, a History and Physical was                         performed, and patient medications and allergies were                         reviewed. The patient is competent. The risks and                         benefits of the procedure and the sedation options and                         risks were discussed with the patient. All questions                         were answered and informed consent was obtained.                         Patient identification and proposed procedure were                         verified by the physician, the nurse, the                         anesthesiologist, the anesthetist and the technician                         in the pre-procedure area in the procedure room in the                         endoscopy suite. Mental Status Examination: alert and                         oriented. Airway Examination: normal oropharyngeal                         airway and neck mobility. Respiratory Examination:  clear to auscultation. CV Examination: normal.                         Prophylactic Antibiotics: The patient does not require                         prophylactic antibiotics. Prior  Anticoagulants: The                         patient has taken no previous anticoagulant or                         antiplatelet agents. ASA Grade Assessment: II - A                         patient with mild systemic disease. After reviewing                         the risks and benefits, the patient was deemed in                         satisfactory condition to undergo the procedure. The                         anesthesia plan was to use general anesthesia.                         Immediately prior to administration of medications,                         the patient was re-assessed for adequacy to receive                         sedatives. The heart rate, respiratory rate, oxygen                         saturations, blood pressure, adequacy of pulmonary                         ventilation, and response to care were monitored                         throughout the procedure. The physical status of the                         patient was re-assessed after the procedure.                        After obtaining informed consent, the colonoscope was                         passed under direct vision. Throughout the procedure,                         the patient's blood pressure, pulse, and oxygen                         saturations were monitored continuously. The  Colonoscope was introduced through the anus and                         advanced to the 10 cm into the ileum. The colonoscopy                         was performed without difficulty. The patient                         tolerated the procedure well. The quality of the bowel                         preparation was evaluated using the BBPS [facility] Bowel                         Preparation Scale) with scores of: Right Colon = 3,                         Transverse Colon = 3 and Left Colon = 3 (entire mucosa                         seen well with no residual staining, small fragments                         of stool  or opaque liquid). The total BBPS score                         equals 9. Findings:      The perianal and digital rectal examinations were normal. Pertinent       negatives include normal sphincter tone and no palpable rectal lesions.      A 9 mm polyp was found in the transverse colon. The polyp was sessile.       The polyp was removed with a hot snare. Resection and retrieval were       complete. To prevent bleeding after the polypectomy, one hemostatic clip       was successfully placed. There was no bleeding during, or at the end, of       the procedure.      A 5 mm polyp was found in the sigmoid colon. The polyp was sessile. The       polyp was removed with a cold snare. Resection and retrieval were       complete.      Two sessile polyps were found in the sigmoid colon. The polyps were 9 to       10 mm in size. These polyps were removed with a hot snare. Resection and       retrieval were complete. Estimated blood loss: none.      A greater than 50 mm polyp was found in the proximal rectum at 20 cm       proximal to the anus. The polyp was pedunculated. The polyp was removed       with a hot snare. Resection and retrieval were complete using a Roth       net. Three hemostatic clips were successfully placed. Estimated blood       loss: none.      A greater than 50 mm polyp was found in the distal rectum close  to the       dentate line. The polyp was pedunculated. The polyp was removed with a       hot snare. Resection and retrieval were complete using a Roth net. Three       hemostatic clips were successfully placed. Estimated blood loss: none.      Two sessile polyps were found in the rectum. The polyps were 5 to 6 mm       in size. These polyps were removed with a cold snare. Resection and       retrieval were complete. Estimated blood loss: none.      Non-bleeding external hemorrhoids were found during retroflexion. The       hemorrhoids were medium-sized. Impression:             - One 9 mm polyp in the transverse colon, removed with                         a hot snare. Resected and retrieved. Clip was placed.                        - One 5 mm polyp in the sigmoid colon, removed with a                         cold snare. Resected and retrieved.                        - Two 9 to 10 mm polyps in the sigmoid colon, removed                         with a hot snare. Resected and retrieved.                        - One greater than 50 mm polyp in the proximal rectum                         at 20 cm proximal to the anus, removed with a hot                         snare. Resected and retrieved. Clips were placed.                        - One greater than 50 mm polyp in the distal rectum,                         removed with a hot snare. Resected and retrieved.                         Clips were placed.                        - Two 5 to 6 mm polyps in the rectum, removed with a                         cold snare. Resected and retrieved.                        - Non-bleeding external hemorrhoids. Recommendation:        -  Discharge patient to home (with escort).                        - Resume previous diet today.                        - Continue present medications.                        - Await pathology results.                        - Repeat colonoscopy in 1 year for surveillance based                         on pathology results. Procedure Code(s):     --- Professional ---                        [phone], Colonoscopy, flexible; with removal of                         tumor(s), polyp(s), or other lesion(s) by snare                         technique Diagnosis Code(s):     --- Professional ---                        K63.5, Polyp of colon                        K62.1, Rectal polyp                        K64.4, Residual hemorrhoidal skin tags                        K52.9, Noninfective gastroenteritis and colitis,                         unspecified CPT copyright 2019  American Medical Association. All rights reserved. The codes documented in this report are preliminary and upon coder review may  be revised to meet current compliance requirements. Dr. Ulyess Mort Lin Landsman MD, MD 10/24/2021 9:32:01 AM This report has been signed electronically. Number of Addenda: 0 Note Initiated On: 10/24/2021 8:20 AM Scope Withdrawal Time: 0 hours 42 minutes 46 seconds  Total Procedure Duration: 0 hours 45 minutes 29 seconds  Estimated Blood Loss:  Estimated blood loss: none.      [facility]

## 2021-10-24 NOTE — Anesthesia Preprocedure Evaluation (Signed)
Anesthesia Evaluation  Patient identified by MRN, date of birth, ID band Patient awake    Reviewed: Allergy & Precautions, NPO status , Patient's Chart, lab work & pertinent test results  Airway Mallampati: III  TM Distance: <3 FB Neck ROM: full  Mouth opening: Limited Mouth Opening  Dental  (+) Caps, Dental Advisory Given,    Pulmonary neg pulmonary ROS, Current SmokerPatient did not abstain from smoking.,    Pulmonary exam normal  + decreased breath sounds      Cardiovascular Exercise Tolerance: Good hypertension, Pt. on medications negative cardio ROS Normal cardiovascular exam Rhythm:Regular     Neuro/Psych negative neurological ROS  negative psych ROS   GI/Hepatic negative GI ROS, Neg liver ROS,   Endo/Other  negative endocrine ROS  Renal/GU negative Renal ROS  negative genitourinary   Musculoskeletal   Abdominal Normal abdominal exam  (+)   Peds negative pediatric ROS (+)  Hematology negative hematology ROS (+)   Anesthesia Other Findings Past Medical History: No date: Hypertension  Past Surgical History: No date: ABDOMINAL HYSTERECTOMY     Reproductive/Obstetrics negative OB ROS                             Anesthesia Physical Anesthesia Plan  ASA: 2  Anesthesia Plan: General   Post-op Pain Management:    Induction: Intravenous  PONV Risk Score and Plan: Propofol infusion and TIVA  Airway Management Planned: Natural Airway  Additional Equipment:   Intra-op Plan:   Post-operative Plan:   Informed Consent: I have reviewed the patients History and Physical, chart, labs and discussed the procedure including the risks, benefits and alternatives for the proposed anesthesia with the patient or authorized representative who has indicated his/her understanding and acceptance.     Dental Advisory Given  Plan Discussed with: CRNA and Surgeon  Anesthesia Plan  Comments:         Anesthesia Quick Evaluation

## 2021-10-24 NOTE — H&P (Signed)
Cephas Darby, MD [address]  [place]  [place], [place] [address]  Main: [phone]  Fax: [phone] Pager: [phone]  Primary Care Physician:  Cletis Athens, MD Primary Gastroenterologist:  Dr. Cephas Darby  Pre-Procedure History & Physical: HPI:  Abigail Wells is a 61 y.o. female is here for an endoscopy and colonoscopy.   Past Medical History:  Diagnosis Date   Hypertension     Past Surgical History:  Procedure Laterality Date   ABDOMINAL HYSTERECTOMY      Prior to Admission medications   Medication Sig Start Date End Date Taking? Authorizing Provider  cholecalciferol (VITAMIN D3) 25 MCG (1000 UNIT) tablet Take 1 tablet (1,000 Units total) by mouth daily. 04/13/21   Cletis Athens, MD  losartan-hydrochlorothiazide (HYZAAR) 50-12.5 MG tablet Take 1 tablet by mouth daily. 04/13/21   Cletis Athens, MD    Allergies as of 09/14/2021 - Review Complete 09/14/2021  Allergen Reaction Noted   Amoxicillin Swelling 04/11/2021   Lisinopril Other (See Comments) 06/15/2021   Penicillins Other (See Comments) 09/12/2021    Family History  Problem Relation Age of Onset   Hypercholesterolemia Mother    Hypertension Mother    Stroke Mother    Early death Father    Hypercholesterolemia Sister    Hypertension Sister    Stroke Sister    Hypertension Son    Diabetes Son     Social History   Socioeconomic History   Marital status: Single    Spouse name: Not on file   Number of children: Not on file   Years of education: Not on file   Highest education level: Not on file  Occupational History   Not on file  Tobacco Use   Smoking status: Every Day    Packs/day: 0.50    Years: 38.00    Total pack years: 19.00    Types: Cigarettes   Smokeless tobacco: Not on file  Vaping Use   Vaping Use: Every day  Substance and Sexual Activity   Alcohol use: Yes    Alcohol/week: 7.0 standard drinks of alcohol    Types: 7 Cans of beer per week   Drug use: Never    Sexual activity: Not Currently    Birth control/protection: None  Other Topics Concern   Not on file  Social History Narrative   Not on file   Social Determinants of Health   Financial Resource Strain: Not on file  Food Insecurity: Not on file  Transportation Needs: Not on file  Physical Activity: Not on file  Stress: Not on file  Social Connections: Not on file  Intimate Partner Violence: Not on file    Review of Systems: See HPI, otherwise negative ROS  Physical Exam: BP (!) 143/81   Pulse 64   Temp 97.8 F (36.6 C) (Temporal)   Resp 18   Ht 4' 11.75" (1.518 m)   Wt 52.2 kg   SpO2 99%   BMI 22.65 kg/m  General:   Alert,  pleasant and cooperative in NAD Head:  Normocephalic and atraumatic. Neck:  Supple; no masses or thyromegaly. Lungs:  Clear throughout to auscultation.    Heart:  Regular rate and rhythm. Abdomen:  Soft, nontender and nondistended. Normal bowel sounds, without guarding, and without rebound.   Neurologic:  Alert and  oriented x4;  grossly normal neurologically.  Impression/Plan: Abigail Wells is here for an endoscopy and colonoscopy to be performed for chronic diarrhea  Risks, benefits, limitations, and alternatives regarding  endoscopy and colonoscopy have been reviewed with the patient.  Questions have been answered.  All parties agreeable.   Sherri Sear, MD  10/24/2021, 8:15 AM

## 2021-10-24 NOTE — Anesthesia Postprocedure Evaluation (Signed)
Anesthesia Post Note  Patient: Abigail Wells  Procedure(s) Performed: COLONOSCOPY WITH PROPOFOL ESOPHAGOGASTRODUODENOSCOPY (EGD) WITH PROPOFOL  Patient location during evaluation: PACU Anesthesia Type: General Level of consciousness: awake and oriented Pain management: pain level controlled Vital Signs Assessment: post-procedure vital signs reviewed and stable Respiratory status: spontaneous breathing and respiratory function stable Anesthetic complications: no   No notable events documented.   Last Vitals:  Vitals:   10/24/21 0931 10/24/21 0957  BP:  138/78  Pulse:    Resp:    Temp: (!) 35.4 C (!) 35.8 C  SpO2:      Last Pain:  Vitals:   10/24/21 0957  TempSrc: Temporal  PainSc: 0-No pain                 VAN STAVEREN,Leena Tiede

## 2021-10-24 NOTE — Anesthesia Procedure Notes (Signed)
Date/Time: 10/24/2021 8:30 AM  Performed by: Johnna Acosta, CRNAPre-anesthesia Checklist: Patient identified, Emergency Drugs available, Suction available, Patient being monitored and Timeout performed Patient Re-evaluated:Patient Re-evaluated prior to induction Oxygen Delivery Method: Nasal cannula Preoxygenation: Pre-oxygenation with 100% oxygen Induction Type: IV induction

## 2021-10-24 NOTE — Op Note (Signed)
[facility] Gastroenterology Patient Name: Abigail Wells Procedure Date: 10/24/2021 8:21 AM MRN: [id number] Account #: [id number] Date of Birth: Oct 27, 1960 Admit Type: Outpatient Age: 61 Room: [facility] ENDO ROOM 3 Gender: Female Note Status: Finalized Instrument Name: Upper Endoscope [id number] Procedure:             Upper GI endoscopy Indications:           Diarrhea Providers:             Lin Landsman MD, MD Referring MD:          Cletis Athens, MD (Referring MD) Medicines:             General Anesthesia Complications:         No immediate complications. Estimated blood loss: None. Procedure:             Pre-Anesthesia Assessment:                        - Prior to the procedure, a History and Physical was                         performed, and patient medications and allergies were                         reviewed. The patient is competent. The risks and                         benefits of the procedure and the sedation options and                         risks were discussed with the patient. All questions                         were answered and informed consent was obtained.                         Patient identification and proposed procedure were                         verified by the physician, the nurse, the                         anesthesiologist, the anesthetist and the technician                         in the pre-procedure area in the procedure room in the                         endoscopy suite. Mental Status Examination: alert and                         oriented. Airway Examination: normal oropharyngeal                         airway and neck mobility. Respiratory Examination:                         clear to auscultation. CV Examination: normal.  Prophylactic Antibiotics: The patient does not require                         prophylactic antibiotics. Prior Anticoagulants: The                         patient has taken no  previous anticoagulant or                         antiplatelet agents. ASA Grade Assessment: II - A                         patient with mild systemic disease. After reviewing                         the risks and benefits, the patient was deemed in                         satisfactory condition to undergo the procedure. The                         anesthesia plan was to use general anesthesia.                         Immediately prior to administration of medications,                         the patient was re-assessed for adequacy to receive                         sedatives. The heart rate, respiratory rate, oxygen                         saturations, blood pressure, adequacy of pulmonary                         ventilation, and response to care were monitored                         throughout the procedure. The physical status of the                         patient was re-assessed after the procedure.                        After obtaining informed consent, the endoscope was                         passed under direct vision. Throughout the procedure,                         the patient's blood pressure, pulse, and oxygen                         saturations were monitored continuously. The Endoscope                         was introduced through the mouth, and advanced to the  second part of duodenum. The upper GI endoscopy was                         accomplished without difficulty. The patient tolerated                         the procedure well. Findings:      The duodenal bulb and second portion of the duodenum were normal.       Biopsies for histology were taken with a cold forceps for evaluation of       celiac disease.      Diffuse moderately erythematous mucosa without bleeding was found in the       gastric body. Biopsies were taken with a cold forceps for Helicobacter       pylori testing.      The incisura and gastric antrum were normal. Biopsies  were taken with a       cold forceps for Helicobacter pylori testing.      The cardia and gastric fundus were normal on retroflexion.      Esophagogastric landmarks were identified: the gastroesophageal junction       was found at 38 cm from the incisors.      The gastroesophageal junction and examined esophagus were normal. Impression:            - Normal duodenal bulb and second portion of the                         duodenum. Biopsied.                        - Erythematous mucosa in the gastric body. Biopsied.                        - Normal incisura and antrum. Biopsied.                        - Esophagogastric landmarks identified.                        - Normal gastroesophageal junction and esophagus. Recommendation:        - Await pathology results.                        - Proceed with colonoscopy as scheduled                        See colonoscopy report Procedure Code(s):     --- Professional ---                        [phone], Esophagogastroduodenoscopy, flexible,                         transoral; with biopsy, single or multiple Diagnosis Code(s):     --- Professional ---                        K31.89, Other diseases of stomach and duodenum                        R19.7, Diarrhea, unspecified CPT copyright 2019 American Medical Association. All rights  reserved. The codes documented in this report are preliminary and upon coder review may  be revised to meet current compliance requirements. Dr. Ulyess Mort Lin Landsman MD, MD 10/24/2021 8:38:53 AM This report has been signed electronically. Number of Addenda: 0 Note Initiated On: 10/24/2021 8:21 AM Estimated Blood Loss:  Estimated blood loss: none.      [facility]

## 2021-10-24 NOTE — Transfer of Care (Signed)
Immediate Anesthesia Transfer of Care Note  Patient: Abigail Wells  Procedure(s) Performed: COLONOSCOPY WITH PROPOFOL ESOPHAGOGASTRODUODENOSCOPY (EGD) WITH PROPOFOL  Patient Location: PACU  Anesthesia Type:General  Level of Consciousness: awake, alert  and oriented  Airway & Oxygen Therapy: Patient Spontanous Breathing  Post-op Assessment: Report given to RN and Post -op Vital signs reviewed and stable  Post vital signs: Reviewed and stable  Last Vitals:  Vitals Value Taken Time  BP 117/61 10/24/21 0930  Temp    Pulse 69 10/24/21 0930  Resp 16 10/24/21 0931  SpO2 100 % 10/24/21 0930  Vitals shown include unvalidated device data.  Last Pain:  Vitals:   10/24/21 0930  TempSrc:   PainSc: 0-No pain         Complications: No notable events documented.

## 2021-10-25 ENCOUNTER — Encounter: Payer: Self-pay | Admitting: Gastroenterology

## 2021-10-28 LAB — SURGICAL PATHOLOGY

## 2021-10-31 ENCOUNTER — Encounter: Payer: Self-pay | Admitting: Gastroenterology

## 2021-10-31 ENCOUNTER — Telehealth: Payer: Self-pay

## 2021-10-31 NOTE — Telephone Encounter (Signed)
Tried to call patient but mailbox is full  

## 2021-11-01 NOTE — Telephone Encounter (Signed)
Tried to call primary number and voicemail is full. Tried to call home number and unable to leave a message it said please enter your remote access code

## 2021-11-01 NOTE — Telephone Encounter (Signed)
Patient verbalized understanding of results. Patient states that she does not want to schedule the Flexsigmoid at this time but will call us to schedule when she is ready

## 2022-01-13 ENCOUNTER — Other Ambulatory Visit: Payer: Self-pay | Admitting: *Deleted

## 2022-01-13 DIAGNOSIS — Z1231 Encounter for screening mammogram for malignant neoplasm of breast: Secondary | ICD-10-CM

## 2022-02-06 ENCOUNTER — Ambulatory Visit (INDEPENDENT_AMBULATORY_CARE_PROVIDER_SITE_OTHER): Payer: Managed Care, Other (non HMO) | Admitting: Gastroenterology

## 2022-02-06 ENCOUNTER — Other Ambulatory Visit: Payer: Self-pay

## 2022-02-06 ENCOUNTER — Encounter: Payer: Self-pay | Admitting: Gastroenterology

## 2022-02-06 VITALS — BP 150/85 | HR 66 | Temp 98.4°F | Ht 59.0 in | Wt 119.5 lb

## 2022-02-06 DIAGNOSIS — K580 Irritable bowel syndrome with diarrhea: Secondary | ICD-10-CM | POA: Diagnosis not present

## 2022-02-06 DIAGNOSIS — D128 Benign neoplasm of rectum: Secondary | ICD-10-CM | POA: Diagnosis not present

## 2022-02-06 DIAGNOSIS — K621 Rectal polyp: Secondary | ICD-10-CM

## 2022-02-06 MED ORDER — AMITRIPTYLINE HCL 25 MG PO TABS: 25.0000 mg | ORAL_TABLET | Freq: Every day | ORAL | 0 refills | Status: DC

## 2022-02-06 NOTE — Progress Notes (Signed)
Cephas Darby, MD [address]  [place]  [place], [place] [address]  Main: [phone]  Fax: [phone]    Gastroenterology Consultation  Referring Provider:     Cletis Athens, MD Primary Care Physician:  Cletis Athens, MD Primary Gastroenterologist:  Dr. Cephas Darby Reason for Consultation:     Chronic abdominal pain, chronic diarrhea        HPI:   Abigail Wells is a 61 y.o. female referred by Dr. Cletis Athens, MD  for consultation & management of chronic abdominal pain and diarrhea.  Patient reports that she has been experiencing generalized abdominal pain for several years associated with chronic nonbloody diarrhea, she generally has bowel movements 5 to 6/day, not associated with any urgency or incontinence.  She denies any nocturnal diarrhea.  She reports that her weight has been stable.  She does have abdominal bloating.  She has history of heavy tobacco use.  Her labs are unremarkable other than erythrocytosis.  Patient was told that she has irritable bowel syndrome based on her GI symptoms  NSAIDs: None  Antiplts/Anticoagulants/Anti thrombotics: None  GI Procedures: None She denies family history of IBD, celiac disease, GI malignancy  Past Medical History:  Diagnosis Date   Hypertension     Past Surgical History:  Procedure Laterality Date   ABDOMINAL HYSTERECTOMY     COLONOSCOPY WITH PROPOFOL N/A 10/24/2021   Procedure: COLONOSCOPY WITH PROPOFOL;  Surgeon: Lin Landsman, MD;  Location: [facility] ENDOSCOPY;  Service: Gastroenterology;  Laterality: N/A;   ESOPHAGOGASTRODUODENOSCOPY (EGD) WITH PROPOFOL N/A 10/24/2021   Procedure: ESOPHAGOGASTRODUODENOSCOPY (EGD) WITH PROPOFOL;  Surgeon: Lin Landsman, MD;  Location: [facility] ENDOSCOPY;  Service: Gastroenterology;  Laterality: N/A;    Current Outpatient Medications:    cholecalciferol (VITAMIN D3) 25 MCG (1000 UNIT) tablet, Take 1 tablet (1,000 Units total) by mouth daily., Disp: 90 tablet, Rfl: 3    losartan-hydrochlorothiazide (HYZAAR) 50-12.5 MG tablet, Take 1 tablet by mouth daily., Disp: 90 tablet, Rfl: 3    Family History  Problem Relation Age of Onset   Hypercholesterolemia Mother    Hypertension Mother    Stroke Mother    Early death Father    Hypercholesterolemia Sister    Hypertension Sister    Stroke Sister    Hypertension Son    Diabetes Son      Social History   Tobacco Use   Smoking status: Every Day    Packs/day: 0.50    Years: 38.00    Total pack years: 19.00    Types: Cigarettes  Vaping Use   Vaping Use: Every day  Substance Use Topics   Alcohol use: Yes    Alcohol/week: 7.0 standard drinks of alcohol    Types: 7 Cans of beer per week   Drug use: Never    Allergies as of 02/06/2022 - Review Complete 02/06/2022  Allergen Reaction Noted   Amoxicillin Swelling 04/11/2021   Lisinopril Other (See Comments) 06/15/2021   Penicillins Other (See Comments) 09/12/2021    Review of Systems:    All systems reviewed and negative except where noted in HPI.   Physical Exam:  BP (!) 150/85 (BP Location: Left Arm, Patient Position: Sitting, Cuff Size: Normal)   Pulse 66   Temp 98.4 F (36.9 C) (Oral)   Ht '4\' 11"'$  (1.499 m)   Wt 119 lb 8 oz (54.2 kg)   BMI 24.14 kg/m  No LMP recorded.  General:   Alert,  Well-developed, well-nourished, pleasant and cooperative in  NAD Head:  Normocephalic and atraumatic. Eyes:  Sclera clear, no icterus.   Conjunctiva pink. Ears:  Normal auditory acuity. Nose:  No deformity, discharge, or lesions. Mouth:  No deformity or lesions,oropharynx pink & moist. Neck:  Supple; no masses or thyromegaly. Lungs:  Respirations even and unlabored.  Clear throughout to auscultation.   No wheezes, crackles, or rhonchi. No acute distress. Heart:  Regular rate and rhythm; no murmurs, clicks, rubs, or gallops. Abdomen:  Normal bowel sounds. Soft, non-tender and mildly distended, tympanic to percussion without masses, hepatosplenomegaly or  hernias noted.  No guarding or rebound tenderness.   Rectal: Not performed Msk:  Symmetrical without gross deformities. Good, equal movement & strength bilaterally. Pulses:  Normal pulses noted. Extremities:  No clubbing or edema.  No cyanosis. Neurologic:  Alert and oriented x3;  grossly normal neurologically. Skin:  Intact without significant lesions or rashes. No jaundice. Psych:  Alert and cooperative. Normal mood and affect.  Imaging Studies: No recent abdominal imaging  Assessment and Plan:   Abigail Wells is a 61 y.o. female with history of heavy tobacco use, chronic alcohol use, 1-2 beers per night is seen in consultation for generalized abdominal pain associated with chronic nonbloody diarrhea without any weight loss, abdominal bloating Recommend upper endoscopy with gastric, duodenal biopsies and colonoscopy with possible TI evaluation and colon biopsies for further evaluation. If above work-up is unremarkable, recommend to check pancreatic fecal elastase levels, empirically treat for bacterial overgrowth  Follow up in 4 to 5 months   Cephas Darby, MD

## 2022-02-07 ENCOUNTER — Encounter: Payer: Self-pay | Admitting: Gastroenterology

## 2022-02-15 ENCOUNTER — Telehealth: Payer: Self-pay

## 2022-02-15 NOTE — Telephone Encounter (Signed)
Trish called patient to tell her what time to come for Procedure tomorrow. Patient states she can not come at that time because she can not leave work tomorrow at that time. Patient then left me a voicemail to reschedule procedure. Called patient back and she states she has 2 people off work tomorrow and needs a first thing in the morning tomorrow. Informed patient that with this procedure any day you have to be the last procedure of the day. She states then we need to reschedule she states she can only do Monday Tuesdays or Fridays. Moved to 03/19/2022. Mailed out new instructions to patient. Called ENDO and talk to Almyra Free and she will get patient moved to 03/19/2022

## 2022-02-28 ENCOUNTER — Other Ambulatory Visit: Payer: Self-pay | Admitting: Gastroenterology

## 2022-03-16 ENCOUNTER — Encounter: Payer: Self-pay | Admitting: *Deleted

## 2022-03-19 ENCOUNTER — Encounter
Admission: RE | Disposition: A | Discharge status: Home / Self Care | Payer: Self-pay | Source: Home / Self Care | Attending: Gastroenterology

## 2022-03-19 ENCOUNTER — Ambulatory Visit
Admission: RE | Admit: 2022-03-19 | Discharge: 2022-03-19 | Disposition: A | Discharge status: Home / Self Care | Payer: Managed Care, Other (non HMO) | Attending: Gastroenterology | Admitting: Gastroenterology

## 2022-03-19 ENCOUNTER — Encounter: Payer: Self-pay | Admitting: Registered Nurse

## 2022-03-19 DIAGNOSIS — K621 Rectal polyp: Secondary | ICD-10-CM | POA: Insufficient documentation

## 2022-03-19 DIAGNOSIS — Z09 Encounter for follow-up examination after completed treatment for conditions other than malignant neoplasm: Secondary | ICD-10-CM | POA: Insufficient documentation

## 2022-03-19 DIAGNOSIS — F17210 Nicotine dependence, cigarettes, uncomplicated: Secondary | ICD-10-CM | POA: Insufficient documentation

## 2022-03-19 DIAGNOSIS — Z86010 Personal history of colonic polyps: Secondary | ICD-10-CM | POA: Diagnosis not present

## 2022-03-19 HISTORY — PX: FLEXIBLE SIGMOIDOSCOPY: SHX5431

## 2022-03-19 SURGERY — SIGMOIDOSCOPY, FLEXIBLE

## 2022-03-19 MED ORDER — LIDOCAINE HCL URETHRAL/MUCOSAL 2 % EX GEL
CUTANEOUS | Status: DC | PRN
  Administered 2022-03-19: 1

## 2022-03-19 MED ORDER — LIDOCAINE HCL URETHRAL/MUCOSAL 2 % EX GEL
CUTANEOUS | Status: AC
  Filled 2022-03-19: qty 5

## 2022-03-19 NOTE — Op Note (Addendum)
[facility] Gastroenterology Patient Name: Abigail Wells Procedure Date: 03/19/2022 11:11 AM MRN: [id number] Account #: [id number] Date of Birth: [birth year] Admit Type: Outpatient Age: 61 Room: [facility] ENDO ROOM 4 Gender: Female Note Status: Finalized Instrument Name: Upper Endoscope [id number] Procedure:             Flexible Sigmoidoscopy Indications:           Surveillance: History of piecemeal removal adenoma on                         last colonoscopy (< 3 yrs) Providers:             Lin Landsman MD, MD Referring MD:          Cletis Athens, MD (Referring MD) Medicines:             None Complications:         No immediate complications. Estimated blood loss: None. Procedure:             Pre-Anesthesia Assessment:                        - Prior to the procedure, a History and Physical was                         performed, and patient medications and allergies were                         reviewed. The patient is competent. The risks and                         benefits of the procedure and the sedation options and                         risks were discussed with the patient. All questions                         were answered and informed consent was obtained.                         Patient identification and proposed procedure were                         verified by the physician, the nurse and the                         technician in the pre-procedure area in the procedure                         room in the endoscopy suite. Mental Status                         Examination: alert and oriented. Airway Examination:                         normal oropharyngeal airway and neck mobility.                         Respiratory Examination: clear to auscultation. CV  Examination: normal. Prophylactic Antibiotics: The                         patient does not require prophylactic antibiotics.                         Prior Anticoagulants: The  patient has taken no                         anticoagulant or antiplatelet agents. ASA Grade                         Assessment: II - A patient with mild systemic disease.                         After reviewing the risks and benefits, the patient                         was deemed in satisfactory condition to undergo the                         procedure. The anesthesia plan was to use no sedation                         or anesthesia. Immediately prior to administration of                         medications, the patient was re-assessed for adequacy                         to receive sedatives. The heart rate, respiratory                         rate, oxygen saturations, blood pressure, adequacy of                         pulmonary ventilation, and response to care were                         monitored throughout the procedure. The physical                         status of the patient was re-assessed after the                         procedure.                        After obtaining informed consent, the scope was passed                         under direct vision. The Endoscope was introduced                         through the anus and advanced to the the descending                         colon. The flexible sigmoidoscopy was accomplished  without difficulty. The patient tolerated the                         procedure well. The quality of the bowel preparation                         was excellent. Findings:      The perianal and digital rectal examinations were normal. Pertinent       negatives include normal sphincter tone and no palpable rectal lesions.      Two sessile polyps were found in the rectum. The polyps were 2 to 3 mm       in size. These polyps were removed with a cold snare. Resection and       retrieval were complete. The post polypectomy site appeared normal      A diminutive polyp was found in the rectum. The polyp was sessile. The        polyp was removed with a cold biopsy forceps. Resection and retrieval       were complete. Estimated blood loss: none.      The exam was otherwise without abnormality. Impression:            - Two 2 to 3 mm polyps in the rectum, removed with a                         cold snare. Resected and retrieved.                        - One diminutive polyp in the rectum, removed with a                         cold biopsy forceps. Resected and retrieved.                        - The examination was otherwise normal. Recommendation:        - Discharge patient to home (with escort).                        - Await pathology results.                        - Resume previous diet today.                        - Repeat flexible sigmoidoscopy in 3 years for                         surveillance based on pathology results. Procedure Code(s):     --- Professional ---                        [phone], Sigmoidoscopy, flexible; with removal of                         tumor(s), polyp(s), or other lesion(s) by snare                         technique  54627, 59, Sigmoidoscopy, flexible; with biopsy,                         single or multiple Diagnosis Code(s):     --- Professional ---                        D12.8, Benign neoplasm of rectum                        Z86.010, Personal history of colonic polyps CPT copyright 2022 American Medical Association. All rights reserved. The codes documented in this report are preliminary and upon coder review may  be revised to meet current compliance requirements. Dr. Ulyess Mort Lin Landsman MD, MD 03/19/2022 11:36:58 AM This report has been signed electronically. Number of Addenda: 0 Note Initiated On: 03/19/2022 11:11 AM Estimated Blood Loss:  Estimated blood loss: none. Estimated blood loss: none.      [facility]

## 2022-03-19 NOTE — H&P (Signed)
Abigail Darby, MD [address]  [place]  [place], [place] [address]  Main: [phone]  Fax: [phone] Pager: [phone]  Primary Care Physician:  Abigail Athens, MD Primary Gastroenterologist:  Dr. Cephas Wells  Pre-Procedure History & Physical: HPI:  Abigail Wells is a 61 y.o. female is here for an flexible sigmoidoscopy.   Past Medical History:  Diagnosis Date   Hypertension     Past Surgical History:  Procedure Laterality Date   ABDOMINAL HYSTERECTOMY     COLONOSCOPY WITH PROPOFOL N/A 10/24/2021   Procedure: COLONOSCOPY WITH PROPOFOL;  Surgeon: Lin Landsman, MD;  Location[facility] ENDOSCOPY;  Service: Gastroenterology;  Laterality: N/A;   ESOPHAGOGASTRODUODENOSCOPY (EGD) WITH PROPOFOL N/A 10/24/2021   Procedure: ESOPHAGOGASTRODUODENOSCOPY (EGD) WITH PROPOFOL;  Surgeon: Lin Landsman, MD;  Location: [facility] ENDOSCOPY;  Service: Gastroenterology;  Laterality: N/A;    Prior to Admission medications   Medication Sig Start Date End Date Taking? Authorizing Provider  cholecalciferol (VITAMIN D3) 25 MCG (1000 UNIT) tablet Take 1 tablet (1,000 Units total) by mouth daily. 04/13/21  Yes Masoud, Viann Shove, MD  losartan-hydrochlorothiazide (HYZAAR) 50-12.5 MG tablet Take 1 tablet by mouth daily. 04/13/21  Yes Masoud, Viann Shove, MD  amitriptyline (ELAVIL) 25 MG tablet TAKE 1 TABLET BY MOUTH EVERYDAY AT BEDTIME 02/28/22   Lin Landsman, MD    Allergies as of 02/07/2022 - Review Complete 02/06/2022  Allergen Reaction Noted   Amoxicillin Swelling 04/11/2021   Lisinopril Other (See Comments) 06/15/2021   Penicillins Other (See Comments) 09/12/2021    Family History  Problem Relation Age of Onset   Hypercholesterolemia Mother    Hypertension Mother    Stroke Mother    Early death Father    Hypercholesterolemia Sister    Hypertension Sister    Stroke Sister    Hypertension Son    Diabetes Son     Social History   Socioeconomic History   Marital status:  Single    Spouse name: Not on file   Number of children: Not on file   Years of education: Not on file   Highest education level: Not on file  Occupational History   Not on file  Tobacco Use   Smoking status: Every Day    Packs/day: 0.50    Years: 38.00    Total pack years: 19.00    Types: Cigarettes   Smokeless tobacco: Not on file  Vaping Use   Vaping Use: Every day  Substance and Sexual Activity   Alcohol use: Yes    Alcohol/week: 7.0 standard drinks of alcohol    Types: 7 Cans of beer per week   Drug use: Never   Sexual activity: Not Currently    Birth control/protection: None  Other Topics Concern   Not on file  Social History Narrative   Not on file   Social Determinants of Health   Financial Resource Strain: Not on file  Food Insecurity: Not on file  Transportation Needs: Not on file  Physical Activity: Not on file  Stress: Not on file  Social Connections: Not on file  Intimate Partner Violence: Not on file    Review of Systems: See HPI, otherwise negative ROS  Physical Exam: BP 134/82   Pulse 80   Temp 97.8 F (36.6 C) (Tympanic)   Resp 16   Ht 4' 11.75" (1.518 m)   Wt 54.4 kg   SpO2 99%   BMI 23.63 kg/m  General:   Alert,  pleasant and cooperative in NAD Head:  Normocephalic and atraumatic. Neck:  Supple; no masses or thyromegaly. Lungs:  Clear throughout to auscultation.    Heart:  Regular rate and rhythm. Abdomen:  Soft, nontender and nondistended. Normal bowel sounds, without guarding, and without rebound.   Neurologic:  Alert and  oriented x4;  grossly normal neurologically.  Impression/Plan: Abigail Wells is here for an flexible sigmoidoscopy to be performed for follow up of rectal adenoma  Risks, benefits, limitations, and alternatives regarding  flexible sigmoidoscopy have been reviewed with the patient.  Questions have been answered.  All parties agreeable.   Sherri Sear, MD  03/19/2022, 11:14 AM

## 2022-03-20 ENCOUNTER — Encounter: Payer: Self-pay | Admitting: Gastroenterology

## 2022-03-20 LAB — SURGICAL PATHOLOGY

## 2022-03-21 ENCOUNTER — Encounter: Payer: Self-pay | Admitting: Gastroenterology

## 2022-04-15 ENCOUNTER — Other Ambulatory Visit: Payer: Self-pay | Admitting: Internal Medicine

## 2022-04-15 DIAGNOSIS — I10 Essential (primary) hypertension: Secondary | ICD-10-CM

## 2022-07-12 ENCOUNTER — Ambulatory Visit: Payer: Managed Care, Other (non HMO) | Admitting: Nurse Practitioner

## 2022-07-12 ENCOUNTER — Encounter: Payer: Self-pay | Admitting: Nurse Practitioner

## 2022-07-12 ENCOUNTER — Ambulatory Visit
Admission: RE | Admit: 2022-07-12 | Discharge: 2022-07-12 | Disposition: A | Discharge status: Home / Self Care | Payer: Managed Care, Other (non HMO) | Source: Ambulatory Visit | Attending: Internal Medicine | Admitting: Internal Medicine

## 2022-07-12 VITALS — BP 138/70 | HR 88 | Temp 98.0°F | Ht 59.0 in | Wt 124.6 lb

## 2022-07-12 DIAGNOSIS — E559 Vitamin D deficiency, unspecified: Secondary | ICD-10-CM

## 2022-07-12 DIAGNOSIS — Z1231 Encounter for screening mammogram for malignant neoplasm of breast: Secondary | ICD-10-CM | POA: Insufficient documentation

## 2022-07-12 DIAGNOSIS — I10 Essential (primary) hypertension: Secondary | ICD-10-CM

## 2022-07-12 DIAGNOSIS — F419 Anxiety disorder, unspecified: Secondary | ICD-10-CM

## 2022-07-12 DIAGNOSIS — F32A Depression, unspecified: Secondary | ICD-10-CM | POA: Insufficient documentation

## 2022-07-12 DIAGNOSIS — F17200 Nicotine dependence, unspecified, uncomplicated: Secondary | ICD-10-CM | POA: Insufficient documentation

## 2022-07-12 DIAGNOSIS — G7100 Muscular dystrophy, unspecified: Secondary | ICD-10-CM

## 2022-07-12 MED ORDER — LOSARTAN POTASSIUM-HCTZ 50-12.5 MG PO TABS: 1.0000 | ORAL_TABLET | Freq: Every day | ORAL | 0 refills | Status: DC

## 2022-07-12 NOTE — Progress Notes (Signed)
New Patient Office Visit  Subjective    Patient ID: Abigail Wells, female    DOB: [birth year]  Age: 62 y.o. MRN: [id number]  CC:  Chief Complaint  Patient presents with   Establish Care    HPI Abigail Wells presents to establish care. Her previous PCP was Dr. Lavera Guise. She has history of hypertension,  smoking, anxiety and depression, muscular dystrophy and vitamin d deficiency.  She has strong family history for fascioscapulohumeral muscular dystrophy. She has some weakness of leg and have to walk slowly to prevent tripping over.   She smokes 6 cigarettes a day and I not ready to quit at present and drink one beer daily.    Health Maintenance  Topic Date Due   COVID-19 Vaccine (1) Never done   HIV Screening  Never done   Hepatitis C Screening  Never done   DTaP/Tdap/Td (1 - Tdap) Never done   PAP SMEAR-Modifier  Never done   MAMMOGRAM  Never done   Zoster Vaccines- Shingrix (1 of 2) Never done   INFLUENZA VACCINE  Never done   COLONOSCOPY (Pts 45-70yr Insurance coverage will need to be confirmed)  10/24/2024   HPV VACCINES  Aged Out    There are no preventive care reminders to display for this patient.  Outpatient Encounter Medications as of 07/12/2022  Medication Sig   amitriptyline (ELAVIL) 25 MG tablet TAKE 1 TABLET BY MOUTH EVERYDAY AT BEDTIME   cholecalciferol (VITAMIN D3) 25 MCG (1000 UNIT) tablet Take 1 tablet (1,000 Units total) by mouth daily.   [DISCONTINUED] losartan-hydrochlorothiazide (HYZAAR) 50-12.5 MG tablet TAKE 1 TABLET BY MOUTH EVERY DAY   losartan-hydrochlorothiazide (HYZAAR) 50-12.5 MG tablet Take 1 tablet by mouth daily.   No facility-administered encounter medications on file as of 07/12/2022.    Past Medical History:  Diagnosis Date   Hypertension     Past Surgical History:  Procedure Laterality Date   ABDOMINAL HYSTERECTOMY     COLONOSCOPY WITH PROPOFOL N/A 10/24/2021   Procedure: COLONOSCOPY WITH PROPOFOL;  Surgeon: VLin Landsman  MD;  [facility];  Service: Gastroenterology;  Laterality: N/A;   ESOPHAGOGASTRODUODENOSCOPY (EGD) WITH PROPOFOL N/A 10/24/2021   Procedure: ESOPHAGOGASTRODUODENOSCOPY (EGD) WITH PROPOFOL;  Surgeon: VLin Landsman MD;  [facility];  Service: Gastro[facility]ology;  Laterality: N/A;   FLEXIBLE SIGMOIDOSCOPY N/A 03/19/2022   Procedure: FLEXIBLE SIGMOIDOSCOPY;  Surgeon: VLin Landsman MD;  Location: [facility] ENDOSCOPY;  Service: Gastroenterology;  Laterality: N/A;    Family History  Problem Relation Age of Onset   Hypercholesterolemia Mother    Hypertension Mother    Stroke Mother    Early death Father    Hypercholesterolemia Sister    Hypertension Sister    Stroke Sister    Hypertension Son    Diabetes Son     Social History   Socioeconomic History   Marital status: Single    Spouse name: Not on file   Number of children: Not on file   Years of education: Not on file   Highest education level: Not on file  Occupational History   Not on file  Tobacco Use   Smoking status: Every Day    Packs/day: 0.50    Years: 38.00    Total pack years: 19.00    Types: Cigarettes   Smokeless tobacco: Not on file  Vaping Use   Vaping Use: Every day  Substance and Sexual Activity   Alcohol use: Yes    Alcohol/week: 7.0 standard drinks of alcohol  Types: 7 Cans of beer per week   Drug use: Never   Sexual activity: Not Currently    Birth control/protection: None  Other Topics Concern   Not on file  Social History Narrative   Not on file   Social Determinants of Health   Financial Resource Strain: Not on file  Food Insecurity: Not on file  Transportation Needs: Not on file  Physical Activity: Not on file  Stress: Not on file  Social Connections: Not on file  Intimate Partner Violence: Not on file    Review of Systems  Constitutional: Negative.   HENT:  Negative for congestion and hearing loss.   Eyes: Negative.   Respiratory:  Negative for cough  and shortness of breath.   Cardiovascular:  Negative for chest pain and claudication.  Gastrointestinal:  Negative for constipation and vomiting.  Genitourinary: Negative.   Musculoskeletal: Negative.   Skin: Negative.   Neurological: Negative.   Psychiatric/Behavioral: Negative.          Objective    BP 138/70   Pulse 88   Temp 98 F (36.7 C) (Oral)   Ht '4\' 11"'$  (1.499 m)   Wt 124 lb 9.6 oz (56.5 kg)   SpO2 97%   BMI 25.17 kg/m   Physical Exam Constitutional:      Appearance: Normal appearance. She is normal weight.     Comments: Walks slowly  HENT:     Right Ear: Tympanic membrane normal.     Left Ear: Tympanic membrane normal.     Nose: Nose normal.     Mouth/Throat:     Mouth: Mucous membranes are moist.     Pharynx: Oropharynx is clear.  Eyes:     Conjunctiva/sclera: Conjunctivae normal.     Pupils: Pupils are equal, round, and reactive to light.  Cardiovascular:     Rate and Rhythm: Normal rate and regular rhythm.     Pulses: Normal pulses.     Heart sounds: Normal heart sounds.  Pulmonary:     Effort: Pulmonary effort is normal.     Breath sounds: Normal breath sounds.  Abdominal:     General: Bowel sounds are normal. There is distension.     Palpations: Abdomen is soft.  Musculoskeletal:        General: No swelling.     Cervical back: Normal range of motion.     Right lower leg: No edema.     Left lower leg: No edema.  Skin:    Capillary Refill: Capillary refill takes less than 2 seconds.     Coloration: Skin is not jaundiced.     Findings: No erythema.  Neurological:     General: No focal deficit present.     Mental Status: She is alert and oriented to person, place, and time. Mental status is at baseline.  Psychiatric:        Mood and Affect: Mood normal.        Behavior: Behavior normal.        Thought Content: Thought content normal.        Judgment: Judgment normal.         Assessment & Plan:  Anxiety and depression Assessment &  Plan: Stable on medication. Continue 1/2 a tablet of amitriptyline 25 mg daily at bedtime.  Orders: -     Comprehensive metabolic panel; Future -     TSH; Future  Primary hypertension Assessment & Plan: Patient BP  Vitals:   07/12/22 1408  BP: 138/70  in the office 07/12/22  Advised pt to follow a low sodium and heart healthy diet. Refilled losartan HCTZ    Orders: -     Losartan Potassium-HCTZ; Take 1 tablet by mouth daily.  Dispense: 90 tablet; Refill: 0 -     CBC with Differential/Platelet; Future -     Comprehensive metabolic panel; Future -     Lipid panel; Future  Smoker Assessment & Plan: Smokes 6 cigarettes a day. She is not ready to quit at present. Discussed risk factor associated with smoking.   Vitamin D deficiency Assessment & Plan: Will check vitamin D levels. Continue vitamin D supplements daily.  Orders: -     VITAMIN D 25 Hydroxy (Vit-D Deficiency, Fractures); Future  Muscular dystrophy [facility]) Assessment & Plan: Stable at present     Return in about 4 months (around 11/11/2022) for physical.   Theresia Lo, NP

## 2022-07-12 NOTE — Assessment & Plan Note (Signed)
Patient BP  Vitals:   07/12/22 1408  BP: 138/70    in the office 07/12/22  Advised pt to follow a low sodium and heart healthy diet. Refilled losartan HCTZ

## 2022-07-12 NOTE — Patient Instructions (Addendum)
Prescription of losartan set to the pharmacy. Labs ordered. Please schedule the fasting  lab appointment next week. CPE in 4 month.

## 2022-07-12 NOTE — Assessment & Plan Note (Signed)
Will check vitamin D levels. Continue vitamin D supplements daily.

## 2022-07-12 NOTE — Assessment & Plan Note (Signed)
Smokes 6 cigarettes a day. She is not ready to quit at present. Discussed risk factor associated with smoking.

## 2022-07-12 NOTE — Assessment & Plan Note (Signed)
Stable at present.   

## 2022-07-12 NOTE — Assessment & Plan Note (Signed)
Stable on medication. Continue 1/2 a tablet of amitriptyline 25 mg daily at bedtime.

## 2022-07-18 ENCOUNTER — Other Ambulatory Visit (INDEPENDENT_AMBULATORY_CARE_PROVIDER_SITE_OTHER): Payer: Managed Care, Other (non HMO)

## 2022-07-18 DIAGNOSIS — F419 Anxiety disorder, unspecified: Secondary | ICD-10-CM

## 2022-07-18 DIAGNOSIS — F32A Depression, unspecified: Secondary | ICD-10-CM | POA: Diagnosis not present

## 2022-07-18 DIAGNOSIS — I10 Essential (primary) hypertension: Secondary | ICD-10-CM | POA: Diagnosis not present

## 2022-07-18 DIAGNOSIS — E559 Vitamin D deficiency, unspecified: Secondary | ICD-10-CM

## 2022-07-18 LAB — CBC WITH DIFFERENTIAL/PLATELET
Basophils Absolute: 0.1 10*3/uL (ref 0.0–0.1)
Basophils Relative: 0.6 % (ref 0.0–3.0)
Eosinophils Absolute: 0.2 10*3/uL (ref 0.0–0.7)
Eosinophils Relative: 2.5 % (ref 0.0–5.0)
HCT: 49.3 % — ABNORMAL HIGH (ref 36.0–46.0)
Hemoglobin: 16.5 g/dL — ABNORMAL HIGH (ref 12.0–15.0)
Lymphocytes Relative: 31.6 % (ref 12.0–46.0)
Lymphs Abs: 2.7 10*3/uL (ref 0.7–4.0)
MCHC: 33.4 g/dL (ref 30.0–36.0)
MCV: 95.2 fl (ref 78.0–100.0)
Monocytes Absolute: 0.6 10*3/uL (ref 0.1–1.0)
Monocytes Relative: 6.7 % (ref 3.0–12.0)
Neutro Abs: 5 10*3/uL (ref 1.4–7.7)
Neutrophils Relative %: 58.6 % (ref 43.0–77.0)
Platelets: 322 10*3/uL (ref 150.0–400.0)
RBC: 5.18 Mil/uL — ABNORMAL HIGH (ref 3.87–5.11)
RDW: 14.2 % (ref 11.5–15.5)
WBC: 8.6 10*3/uL (ref 4.0–10.5)

## 2022-07-18 LAB — COMPREHENSIVE METABOLIC PANEL
ALT: 15 U/L (ref 0–35)
AST: 17 U/L (ref 0–37)
Albumin: 3.9 g/dL (ref 3.5–5.2)
Alkaline Phosphatase: 85 U/L (ref 39–117)
BUN: 9 mg/dL (ref 6–23)
CO2: 28 mEq/L (ref 19–32)
Calcium: 9.4 mg/dL (ref 8.4–10.5)
Chloride: 102 mEq/L (ref 96–112)
Creatinine, Ser: 0.51 mg/dL (ref 0.40–1.20)
GFR: 100.17 mL/min (ref >60.00)
Glucose, Bld: 92 mg/dL (ref 70–99)
Potassium: 4.5 mEq/L (ref 3.5–5.1)
Sodium: 140 mEq/L (ref 135–145)
Total Bilirubin: 0.9 mg/dL (ref 0.2–1.2)
Total Protein: 6.4 g/dL (ref 6.0–8.3)

## 2022-07-18 LAB — VITAMIN D 25 HYDROXY (VIT D DEFICIENCY, FRACTURES): VITD: 22.36 ng/mL — ABNORMAL LOW (ref 30.00–100.00)

## 2022-07-18 LAB — LIPID PANEL
Cholesterol: 176 mg/dL (ref 0–200)
HDL: 62.1 mg/dL (ref >39.00)
LDL Cholesterol: 100 mg/dL — ABNORMAL HIGH (ref 0–99)
NonHDL: 113.47
Total CHOL/HDL Ratio: 3
Triglycerides: 66 mg/dL (ref 0.0–149.0)
VLDL: 13.2 mg/dL (ref 0.0–40.0)

## 2022-07-18 LAB — TSH: TSH: 1.98 u[IU]/mL (ref 0.35–5.50)

## 2022-07-18 NOTE — Progress Notes (Signed)
Vit D on the lower side. Take OTC vit D supplement. Elevated red cells and hemoglobin will recheck in 3-4 weeks. Liver and kidney looks good. Cholesterol looks good too.

## 2022-07-23 ENCOUNTER — Other Ambulatory Visit: Payer: Self-pay | Admitting: *Deleted

## 2022-07-23 ENCOUNTER — Inpatient Hospital Stay
Admission: RE | Admit: 2022-07-23 | Discharge: 2022-07-23 | Disposition: A | Discharge status: Home / Self Care | Payer: Self-pay | Source: Ambulatory Visit | Attending: *Deleted | Admitting: *Deleted

## 2022-07-23 DIAGNOSIS — Z1231 Encounter for screening mammogram for malignant neoplasm of breast: Secondary | ICD-10-CM

## 2022-08-10 ENCOUNTER — Encounter: Payer: Self-pay | Admitting: Nurse Practitioner

## 2022-08-10 ENCOUNTER — Ambulatory Visit (INDEPENDENT_AMBULATORY_CARE_PROVIDER_SITE_OTHER): Payer: Managed Care, Other (non HMO) | Admitting: Nurse Practitioner

## 2022-08-10 VITALS — BP 130/78 | HR 106 | Temp 97.5°F | Ht 59.0 in | Wt 123.6 lb

## 2022-08-10 DIAGNOSIS — R319 Hematuria, unspecified: Secondary | ICD-10-CM | POA: Diagnosis not present

## 2022-08-10 DIAGNOSIS — R399 Unspecified symptoms and signs involving the genitourinary system: Secondary | ICD-10-CM

## 2022-08-10 DIAGNOSIS — N390 Urinary tract infection, site not specified: Secondary | ICD-10-CM

## 2022-08-10 LAB — POCT URINALYSIS DIPSTICK
Bilirubin, UA: NEGATIVE
Glucose, UA: NEGATIVE
Ketones, UA: NEGATIVE
Nitrite, UA: NEGATIVE
Protein, UA: POSITIVE — AB
Spec Grav, UA: 1.01 (ref 1.010–1.025)
Urobilinogen, UA: 0.2 E.U./dL
pH, UA: 6 (ref 5.0–8.0)

## 2022-08-10 MED ORDER — CIPROFLOXACIN HCL 250 MG PO TABS: 250.0000 mg | ORAL_TABLET | Freq: Two times a day (BID) | ORAL | 0 refills | Status: AC

## 2022-08-10 NOTE — Patient Instructions (Signed)
Urinalysis shows infection and blood. Will send urine for culture. Take over-the-counter Azo for pain relief and increase fluid intake. Antibiotic sent to the pharmacy and change if we need after the culture comes back.

## 2022-08-10 NOTE — Progress Notes (Signed)
Established Patient Office Visit  Subjective:  Patient ID: Abigail Wells Crupi, female    DOB: 1961/02/10  Age: 62 y.o. MRN: [id number]  CC:  Chief Complaint  Patient presents with   Urinary Tract Infection    dysuria    HPI  Abigail Wells Shonka presents for:  Dysuria  This is a new problem. The current episode started today. The problem occurs every urination. The quality of the pain is described as aching. There has been no fever. Associated symptoms include frequency, hematuria, nausea and urgency. Pertinent negatives include no discharge, flank pain or vomiting. She has tried increased fluids for the symptoms. There is no history of kidney stones or recurrent UTIs.     Past Medical History:  Diagnosis Date   Hypertension     Past Surgical History:  Procedure Laterality Date   ABDOMINAL HYSTERECTOMY     COLONOSCOPY WITH PROPOFOL N/A 10/24/2021   Procedure: COLONOSCOPY WITH PROPOFOL;  Surgeon: Toney ReilVanga, Rohini Reddy, MD;  [facility];  Service: Gastroenterology;  Laterality: N/A;   ESOPHAGOGASTRODUODENOSCOPY (EGD) WITH PROPOFOL N/A 10/24/2021   Procedure: ESOPHAGOGASTRODUODENOSCOPY (EGD) WITH PROPOFOL;  Surgeon: Toney ReilVanga, Rohini Reddy, MD;  [facility];  Service: Gastroenterology;  Laterality: N/A;   FLEXIBLE SIGMOIDOSCOPY N/A 03/19/2022   Procedure: FLEXIBLE SIGMOIDOSCOPY;  Surgeon: Toney ReilVanga, Rohini Reddy, MD;  Location: [facility] ENDOSCOPY;  Service: Gastroenterology;  Laterality: N/A;    Family History  Problem Relation Age of Onset   Hypercholesterolemia Mother    Hypertension Mother    Stroke Mother    Early death Father    Hypercholesterolemia Sister    Hypertension Sister    Stroke Sister    Hypertension Son    Diabetes Son     Social History   Socioeconomic History   Marital status: Single    Spouse name: Not on file   Number of children: Not on file   Years of education: Not on file   Highest education level: Not on file  Occupational History    Not on file  Tobacco Use   Smoking status: Every Day    Packs/day: 0.50    Years: 38.00    Additional pack years: 0.00    Total pack years: 19.00    Types: Cigarettes   Smokeless tobacco: Not on file  Vaping Use   Vaping Use: Every day  Substance and Sexual Activity   Alcohol use: Yes    Alcohol/week: 7.0 standard drinks of alcohol    Types: 7 Cans of beer per week   Drug use: Never   Sexual activity: Not Currently    Birth control/protection: None  Other Topics Concern   Not on file  Social History Narrative   Not on file   Social Determinants of Health   Financial Resource Strain: Not on file  Food Insecurity: Not on file  Transportation Needs: Not on file  Physical Activity: Not on file  Stress: Not on file  Social Connections: Not on file  Intimate Partner Violence: Not on file     Outpatient Medications Prior to Visit  Medication Sig Dispense Refill   amitriptyline (ELAVIL) 25 MG tablet TAKE 1 TABLET BY MOUTH EVERYDAY AT BEDTIME 90 tablet 1   cholecalciferol (VITAMIN D3) 25 MCG (1000 UNIT) tablet Take 1 tablet (1,000 Units total) by mouth daily. 90 tablet 3   losartan-hydrochlorothiazide (HYZAAR) 50-12.5 MG tablet Take 1 tablet by mouth daily. 90 tablet 0   No facility-administered medications prior to visit.    Allergies  Allergen Reactions   Amoxicillin Swelling   Lisinopril Other (See Comments)    Other Reaction: EXTREME FATIGUE   Penicillins Other (See Comments)    Other Reaction: FACIAL SWELLING    ROS Review of Systems  Constitutional: Negative.   Respiratory:  Negative for chest tightness and shortness of breath.   Cardiovascular: Negative.   Gastrointestinal:  Positive for nausea. Negative for vomiting.  Genitourinary:  Positive for dysuria, frequency, hematuria and urgency. Negative for flank pain.  Neurological: Negative.   Psychiatric/Behavioral: Negative.        Objective:    Physical Exam Constitutional:      Appearance: Normal  appearance.  Cardiovascular:     Rate and Rhythm: Normal rate and regular rhythm.     Pulses: Normal pulses.     Heart sounds: Normal heart sounds.  Pulmonary:     Effort: Pulmonary effort is normal.     Breath sounds: Normal breath sounds. No stridor. No wheezing.  Abdominal:     General: Bowel sounds are normal. There is no distension.     Palpations: Abdomen is soft.     Tenderness: There is no guarding.     Hernia: No hernia is present.  Neurological:     General: No focal deficit present.     Mental Status: She is alert and oriented to person, place, and time. Mental status is at baseline.  Psychiatric:        Mood and Affect: Mood normal.        Behavior: Behavior normal.        Thought Content: Thought content normal.        Judgment: Judgment normal.     BP 130/78   Pulse (!) 106   Temp (!) 97.5 F (36.4 C) (Oral)   Ht 4\' 11"  (1.499 m)   Wt 123 lb 9.6 oz (56.1 kg)   SpO2 98%   BMI 24.96 kg/m  Wt Readings from Last 3 Encounters:  08/10/22 123 lb 9.6 oz (56.1 kg)  07/12/22 124 lb 9.6 oz (56.5 kg)  03/19/22 120 lb (54.4 kg)     Health Maintenance  Topic Date Due   COVID-19 Vaccine (1) Never done   HIV Screening  Never done   Hepatitis C Screening  Never done   DTaP/Tdap/Td (1 - Tdap) Never done   PAP SMEAR-Modifier  Never done   Zoster Vaccines- Shingrix (1 of 2) Never done   INFLUENZA VACCINE  12/06/2022   MAMMOGRAM  07/11/2024   COLONOSCOPY (Pts 45-17yrs Insurance coverage will need to be confirmed)  10/24/2024   HPV VACCINES  Aged Out    There are no preventive care reminders to display for this patient.  Lab Results  Component Value Date   TSH 1.98 07/18/2022   Lab Results  Component Value Date   WBC 8.6 07/18/2022   HGB 16.5 (H) 07/18/2022   HCT 49.3 (H) 07/18/2022   MCV 95.2 07/18/2022   PLT 322.0 07/18/2022   Lab Results  Component Value Date   NA 140 07/18/2022   K 4.5 07/18/2022   CO2 28 07/18/2022   GLUCOSE 92 07/18/2022   BUN 9  07/18/2022   CREATININE 0.51 07/18/2022   BILITOT 0.9 07/18/2022   ALKPHOS 85 07/18/2022   AST 17 07/18/2022   ALT 15 07/18/2022   PROT 6.4 07/18/2022   ALBUMIN 3.9 07/18/2022   CALCIUM 9.4 07/18/2022   EGFR 105 06/07/2021   GFR 100.17 07/18/2022   Lab Results  Component Value  Date   CHOL 176 07/18/2022   Lab Results  Component Value Date   HDL 62.10 07/18/2022   Lab Results  Component Value Date   LDLCALC 100 (H) 07/18/2022   Lab Results  Component Value Date   TRIG 66.0 07/18/2022   Lab Results  Component Value Date   CHOLHDL 3 07/18/2022   No results found for: "HGBA1C"    Assessment & Plan:  Urinary tract infection with hematuria, site unspecified Assessment & Plan: POCT urinalysis positive for protein, blood, leukocytes. Will send urine for culture. Will start her on Cipro and if needed would adjust antibiotic according to the culture result. Advised patient to take over-the-counter Azo. Increase fluid intake.   UTI symptoms -     Urine Culture -     POCT urinalysis dipstick -     Urinalysis, Routine w reflex microscopic  Other orders -     Ciprofloxacin HCl; Take 1 tablet (250 mg total) by mouth 2 (two) times daily.  Dispense: 6 tablet; Refill: 0 -     Microscopic Examination    Follow-up: Return if symptoms worsen or fail to improve.   Kara Diesharanpreet  Kedarius Aloisi, NP

## 2022-08-11 LAB — URINALYSIS, ROUTINE W REFLEX MICROSCOPIC
Bilirubin, UA: NEGATIVE
Glucose, UA: NEGATIVE
Ketones, UA: NEGATIVE
Nitrite, UA: NEGATIVE
Specific Gravity, UA: 1.006 (ref 1.005–1.030)
Urobilinogen, Ur: 0.2 mg/dL (ref 0.2–1.0)
pH, UA: 7 (ref 5.0–7.5)

## 2022-08-11 LAB — MICROSCOPIC EXAMINATION
Casts: NONE SEEN /lpf
Epithelial Cells (non renal): NONE SEEN /hpf (ref 0–10)
WBC, UA: >30 /hpf — AB (ref 0–5)

## 2022-08-12 LAB — URINE CULTURE
MICRO NUMBER:: 14788390
SPECIMEN QUALITY:: ADEQUATE

## 2022-08-13 NOTE — Progress Notes (Signed)
Please inform patient that the urine culture grew E. coli bacteria.  The antibiotic Cipro prescribed will cover it. How is her symptoms now?

## 2022-08-20 DIAGNOSIS — N390 Urinary tract infection, site not specified: Secondary | ICD-10-CM | POA: Insufficient documentation

## 2022-08-20 NOTE — Assessment & Plan Note (Signed)
POCT urinalysis positive for protein, blood, leukocytes. Will send urine for culture. Will start her on Cipro and if needed would adjust antibiotic according to the culture result. Advised patient to take over-the-counter Azo. Increase fluid intake.

## 2022-09-11 ENCOUNTER — Other Ambulatory Visit: Payer: Self-pay | Admitting: Gastroenterology

## 2022-09-11 NOTE — Telephone Encounter (Signed)
Last office visit 02/06/2022 IBS diarrhea  Last refill 02/28/2022 90 1

## 2022-10-06 ENCOUNTER — Other Ambulatory Visit: Payer: Self-pay | Admitting: Nurse Practitioner

## 2022-10-06 DIAGNOSIS — I10 Essential (primary) hypertension: Secondary | ICD-10-CM

## 2022-11-16 ENCOUNTER — Encounter: Payer: Managed Care, Other (non HMO) | Admitting: Nurse Practitioner

## 2023-01-06 ENCOUNTER — Other Ambulatory Visit: Payer: Self-pay | Admitting: Nurse Practitioner

## 2023-01-06 DIAGNOSIS — I10 Essential (primary) hypertension: Secondary | ICD-10-CM

## 2023-01-18 ENCOUNTER — Other Ambulatory Visit: Payer: Self-pay | Admitting: Gastroenterology

## 2023-03-26 ENCOUNTER — Other Ambulatory Visit: Payer: Self-pay | Admitting: Gastroenterology

## 2023-04-24 ENCOUNTER — Other Ambulatory Visit: Payer: Self-pay | Admitting: Nurse Practitioner

## 2023-04-24 DIAGNOSIS — I10 Essential (primary) hypertension: Secondary | ICD-10-CM

## 2023-07-09 ENCOUNTER — Other Ambulatory Visit: Payer: Self-pay | Admitting: Nurse Practitioner

## 2023-07-09 DIAGNOSIS — I10 Essential (primary) hypertension: Secondary | ICD-10-CM

## 2023-08-22 ENCOUNTER — Encounter: Payer: Self-pay | Admitting: Nurse Practitioner

## 2023-08-22 ENCOUNTER — Ambulatory Visit (INDEPENDENT_AMBULATORY_CARE_PROVIDER_SITE_OTHER): Admitting: Nurse Practitioner

## 2023-08-22 VITALS — BP 114/70 | HR 87 | Temp 97.2°F | Ht 59.0 in | Wt 116.8 lb

## 2023-08-22 DIAGNOSIS — R21 Rash and other nonspecific skin eruption: Secondary | ICD-10-CM

## 2023-08-22 DIAGNOSIS — Z0000 Encounter for general adult medical examination without abnormal findings: Secondary | ICD-10-CM

## 2023-08-22 MED ORDER — VALACYCLOVIR HCL 1 G PO TABS: 1000.0000 mg | ORAL_TABLET | Freq: Two times a day (BID) | ORAL | 0 refills | Status: AC

## 2023-08-22 NOTE — Progress Notes (Signed)
 Established Patient Office Visit  Subjective:  Patient ID: Abigail Wells, female    DOB: Jul 15, 1960  Age: 63 y.o. MRN: [id number]  CC:  Chief Complaint  Patient presents with   Acute Visit    Possible shingles left shoulder and below x 2days Itchy & painful  Discussed the use of a AI scribe software for clinical note transcription with the patient, who gave verbal consent to proceed.   HPI  Abigail Wells is a 63 year old female who presents with a rash on her left shoulder and back.  She has had a rash on her left shoulder and back for the past two days. The rash is itchy and sometimes burning, with soreness upon touch. It was first noticed yesterday afternoon and has remained consistent in size and appearance since then. No recent changes in detergents or personal care products and no contact with known irritants. No history of similar rashes or previous episodes of shingles, although she had chickenpox as a child.  She feels generally unwell, with low energy and slight nausea this morning, but no fever. No other rashes have been noticed on her body.  HPI   Past Medical History:  Diagnosis Date   Hypertension     Past Surgical History:  Procedure Laterality Date   ABDOMINAL HYSTERECTOMY     COLONOSCOPY WITH PROPOFOL  N/A 10/24/2021   Procedure: COLONOSCOPY WITH PROPOFOL ;  Surgeon: Selena Daily, MD;  Location: [facility] ENDOSCOPY;  Service: Gastroenterology;  Laterality: N/A;   ESOPHAGOGASTRODUODENOSCOPY (EGD) WITH PROPOFOL  N/A 10/24/2021   Procedure: ESOPHAGOGASTRODUODENOSCOPY (EGD) WITH PROPOFOL ;  Surgeon: Selena Daily, MD;  Location: [facility] ENDOSCOPY;  Service: Gastroenterology;  Laterality: N/A;   FLEXIBLE SIGMOIDOSCOPY N/A 03/19/2022   Procedure: FLEXIBLE SIGMOIDOSCOPY;  Surgeon: Selena Daily, MD;  Location: [facility] ENDOSCOPY;  Service: Gastroenterology;  Laterality: N/A;    Family History  Problem Relation Age of Onset   Hypercholesterolemia Mother     Hypertension Mother    Stroke Mother    Early death Father    Hypercholesterolemia Sister    Hypertension Sister    Stroke Sister    Hypertension Son    Diabetes Son     Social History   Socioeconomic History   Marital status: Single    Spouse name: Not on file   Number of children: Not on file   Years of education: Not on file   Highest education level: Not on file  Occupational History   Not on file  Tobacco Use   Smoking status: Every Day    Current packs/day: 0.50    Average packs/day: 0.5 packs/day for 38.0 years (19.0 ttl pk-yrs)    Types: Cigarettes   Smokeless tobacco: Not on file  Vaping Use   Vaping status: Every Day  Substance and Sexual Activity   Alcohol use: Yes    Alcohol/week: 7.0 standard drinks of alcohol    Types: 7 Cans of beer per week   Drug use: Never   Sexual activity: Not Currently    Birth control/protection: None  Other Topics Concern   Not on file  Social History Narrative   Not on file   Social Drivers of Health   Financial Resource Strain: Not on file  Food Insecurity: Not on file  Transportation Needs: Not on file  Physical Activity: Not on file  Stress: Not on file  Social Connections: Not on file  Intimate Partner Violence: Not on file     Outpatient Medications Prior to Visit  Medication Sig Dispense Refill   amitriptyline  (ELAVIL ) 25 MG tablet TAKE 1 TABLET BY MOUTH EVERYDAY AT BEDTIME 30 tablet 0   cholecalciferol (VITAMIN D3) 25 MCG (1000 UNIT) tablet Take 1 tablet (1,000 Units total) by mouth daily. 90 tablet 3   ciprofloxacin  (CIPRO ) 250 MG tablet Take 1 tablet (250 mg total) by mouth 2 (two) times daily. 6 tablet 0   losartan -hydrochlorothiazide (HYZAAR) 50-12.5 MG tablet TAKE 1 TABLET BY MOUTH EVERY DAY 90 tablet 0   No facility-administered medications prior to visit.    Allergies  Allergen Reactions   Amoxicillin Swelling   Lisinopril Other (See Comments)    Other Reaction: EXTREME FATIGUE   Penicillins  Other (See Comments)    Other Reaction: FACIAL SWELLING    ROS Review of Systems Negative unless indicated in HPI.    Objective:    Physical Exam Constitutional:      Appearance: Normal appearance.  HENT:     Mouth/Throat:     Mouth: Mucous membranes are moist.  Eyes:     Conjunctiva/sclera: Conjunctivae normal.     Pupils: Pupils are equal, round, and reactive to light.  Cardiovascular:     Rate and Rhythm: Normal rate and regular rhythm.     Pulses: Normal pulses.     Heart sounds: Normal heart sounds.  Pulmonary:     Effort: Pulmonary effort is normal.     Breath sounds: Normal breath sounds.  Musculoskeletal:     Cervical back: Normal range of motion. No tenderness.  Skin:    General: Skin is warm.     Findings: Rash (Erythematous maculopapular rash located on the shoulder and back) present.  Neurological:     General: No focal deficit present.     Mental Status: She is alert and oriented to person, place, and time. Mental status is at baseline.  Psychiatric:        Mood and Affect: Mood normal.        Behavior: Behavior normal.        Thought Content: Thought content normal.        Judgment: Judgment normal.     BP 114/70   Pulse 87   Temp (!) 97.2 F (36.2 C)   Ht 4\' 11"  (1.499 m)   Wt 116 lb 12.8 oz (53 kg)   SpO2 98%   BMI 23.59 kg/m  Wt Readings from Last 3 Encounters:  08/22/23 116 lb 12.8 oz (53 kg)  08/10/22 123 lb 9.6 oz (56.1 kg)  07/12/22 124 lb 9.6 oz (56.5 kg)     Health Maintenance  Topic Date Due   HIV Screening  Never done   Hepatitis C Screening  Never done   DTaP/Tdap/Td (1 - Tdap) Never done   Pneumococcal Vaccine 57-68 Years old (1 of 2 - PCV) Never done   Zoster Vaccines- Shingrix (1 of 2) Never done   Cervical Cancer Screening (HPV/Pap Cotest)  02/24/2011   COVID-19 Vaccine (1 - 2024-25 season) 09/07/2023 (Originally 01/06/2023)   INFLUENZA VACCINE  12/06/2023   MAMMOGRAM  07/11/2024   Colonoscopy  10/24/2024   HPV  VACCINES  Aged Out   Meningococcal B Vaccine  Aged Out     There are no preventive care reminders to display for this patient.  Lab Results  Component Value Date   TSH 1.69 09/02/2023   Lab Results  Component Value Date   WBC 6.6 09/02/2023   HGB 15.8 (H) 09/02/2023   HCT 46.9 (H) 09/02/2023  MCV 95.2 09/02/2023   PLT 235.0 09/02/2023   Lab Results  Component Value Date   NA 137 09/02/2023   K 4.2 09/02/2023   CO2 24 09/02/2023   GLUCOSE 82 09/02/2023   BUN 6 09/02/2023   CREATININE 0.48 09/02/2023   BILITOT 0.7 09/02/2023   ALKPHOS 79 09/02/2023   AST 21 09/02/2023   ALT 16 09/02/2023   PROT 6.9 09/02/2023   ALBUMIN 4.2 09/02/2023   CALCIUM 9.4 09/02/2023   EGFR 105 06/07/2021   GFR 100.84 09/02/2023   Lab Results  Component Value Date   CHOL 176 09/02/2023   Lab Results  Component Value Date   HDL 60.30 09/02/2023   Lab Results  Component Value Date   LDLCALC 104 (H) 09/02/2023   Lab Results  Component Value Date   TRIG 58.0 09/02/2023   Lab Results  Component Value Date   CHOLHDL 3 09/02/2023   No results found for: "HGBA1C"    Assessment & Plan:  Rash Assessment & Plan: Rash on left shoulder and back with itching, burning, and soreness suggests herpes zoster. History of chickenpox increases risk. Initiated antiviral therapy due to potential progression. - Prescribed Valtrex  for 7 days. - Advised to monitor rash for vesicular changes and report worsening symptoms.    Annual physical exam -     CBC with Differential/Platelet; Future -     Comprehensive metabolic panel with GFR; Future -     Lipid panel; Future -     TSH; Future  Other orders -     valACYclovir  HCl; Take 1 tablet (1,000 mg total) by mouth 2 (two) times daily.  Dispense: 14 tablet; Refill: 0    Follow-up: Return in about 2 weeks (around 09/05/2023) for physical, follow up with fasting lab 2 days prior.   Jayani Rozman, NP

## 2023-09-02 ENCOUNTER — Other Ambulatory Visit (INDEPENDENT_AMBULATORY_CARE_PROVIDER_SITE_OTHER)

## 2023-09-02 DIAGNOSIS — Z0000 Encounter for general adult medical examination without abnormal findings: Secondary | ICD-10-CM

## 2023-09-02 LAB — COMPREHENSIVE METABOLIC PANEL WITH GFR
ALT: 16 U/L (ref 0–35)
AST: 21 U/L (ref 0–37)
Albumin: 4.2 g/dL (ref 3.5–5.2)
Alkaline Phosphatase: 79 U/L (ref 39–117)
BUN: 6 mg/dL (ref 6–23)
CO2: 24 meq/L (ref 19–32)
Calcium: 9.4 mg/dL (ref 8.4–10.5)
Chloride: 102 meq/L (ref 96–112)
Creatinine, Ser: 0.48 mg/dL (ref 0.40–1.20)
GFR: 100.84 mL/min (ref >60.00)
Glucose, Bld: 82 mg/dL (ref 70–99)
Potassium: 4.2 meq/L (ref 3.5–5.1)
Sodium: 137 meq/L (ref 135–145)
Total Bilirubin: 0.7 mg/dL (ref 0.2–1.2)
Total Protein: 6.9 g/dL (ref 6.0–8.3)

## 2023-09-02 LAB — CBC WITH DIFFERENTIAL/PLATELET
Basophils Absolute: 0 10*3/uL (ref 0.0–0.1)
Basophils Relative: 0.7 % (ref 0.0–3.0)
Eosinophils Absolute: 0.1 10*3/uL (ref 0.0–0.7)
Eosinophils Relative: 1.6 % (ref 0.0–5.0)
HCT: 46.9 % — ABNORMAL HIGH (ref 36.0–46.0)
Hemoglobin: 15.8 g/dL — ABNORMAL HIGH (ref 12.0–15.0)
Lymphocytes Relative: 35.8 % (ref 12.0–46.0)
Lymphs Abs: 2.4 10*3/uL (ref 0.7–4.0)
MCHC: 33.6 g/dL (ref 30.0–36.0)
MCV: 95.2 fl (ref 78.0–100.0)
Monocytes Absolute: 0.4 10*3/uL (ref 0.1–1.0)
Monocytes Relative: 5.6 % (ref 3.0–12.0)
Neutro Abs: 3.7 10*3/uL (ref 1.4–7.7)
Neutrophils Relative %: 56.3 % (ref 43.0–77.0)
Platelets: 235 10*3/uL (ref 150.0–400.0)
RBC: 4.93 Mil/uL (ref 3.87–5.11)
RDW: 13.9 % (ref 11.5–15.5)
WBC: 6.6 10*3/uL (ref 4.0–10.5)

## 2023-09-02 LAB — LIPID PANEL
Cholesterol: 176 mg/dL (ref 0–200)
HDL: 60.3 mg/dL (ref >39.00)
LDL Cholesterol: 104 mg/dL — ABNORMAL HIGH (ref 0–99)
NonHDL: 116.04
Total CHOL/HDL Ratio: 3
Triglycerides: 58 mg/dL (ref 0.0–149.0)
VLDL: 11.6 mg/dL (ref 0.0–40.0)

## 2023-09-02 LAB — TSH: TSH: 1.69 u[IU]/mL (ref 0.35–5.50)

## 2023-09-03 DIAGNOSIS — R21 Rash and other nonspecific skin eruption: Secondary | ICD-10-CM | POA: Insufficient documentation

## 2023-09-03 NOTE — Assessment & Plan Note (Signed)
 Rash on left shoulder and back with itching, burning, and soreness suggests herpes zoster. History of chickenpox increases risk. Initiated antiviral therapy due to potential progression. - Prescribed Valtrex  for 7 days. - Advised to monitor rash for vesicular changes and report worsening symptoms.

## 2023-09-05 ENCOUNTER — Ambulatory Visit (INDEPENDENT_AMBULATORY_CARE_PROVIDER_SITE_OTHER): Admitting: Nurse Practitioner

## 2023-09-05 ENCOUNTER — Encounter: Payer: Self-pay | Admitting: Nurse Practitioner

## 2023-09-05 ENCOUNTER — Other Ambulatory Visit (HOSPITAL_COMMUNITY)
Admission: RE | Admit: 2023-09-05 | Discharge: 2023-09-05 | Disposition: A | Discharge status: Home / Self Care | Source: Ambulatory Visit | Attending: Nurse Practitioner | Admitting: Nurse Practitioner

## 2023-09-05 VITALS — BP 126/70 | HR 85 | Temp 97.8°F | Ht 59.0 in | Wt 117.8 lb

## 2023-09-05 DIAGNOSIS — Z124 Encounter for screening for malignant neoplasm of cervix: Secondary | ICD-10-CM | POA: Insufficient documentation

## 2023-09-05 DIAGNOSIS — Z0000 Encounter for general adult medical examination without abnormal findings: Secondary | ICD-10-CM

## 2023-09-05 DIAGNOSIS — I10 Essential (primary) hypertension: Secondary | ICD-10-CM | POA: Diagnosis not present

## 2023-09-05 DIAGNOSIS — Z23 Encounter for immunization: Secondary | ICD-10-CM | POA: Diagnosis not present

## 2023-09-05 NOTE — Patient Instructions (Signed)
 Preventive Care 16-64 Years Old, Female  Preventive care refers to lifestyle choices and visits with your health care provider that can promote health and wellness. Preventive care visits are also called wellness exams.  What can I expect for my preventive care visit?  Counseling  Your health care provider may ask you questions about your:  Medical history, including:  Past medical problems.  Family medical history.  Pregnancy history.  Current health, including:  Menstrual cycle.  Method of birth control.  Emotional well-being.  Home life and relationship well-being.  Sexual activity and sexual health.  Lifestyle, including:  Alcohol, nicotine or tobacco, and drug use.  Access to firearms.  Diet, exercise, and sleep habits.  Work and work Astronomer.  Sunscreen use.  Safety issues such as seatbelt and bike helmet use.  Physical exam  Your health care provider will check your:  Height and weight. These may be used to calculate your BMI (body mass index). BMI is a measurement that tells if you are at a healthy weight.  Waist circumference. This measures the distance around your waistline. This measurement also tells if you are at a healthy weight and may help predict your risk of certain diseases, such as type 2 diabetes and high blood pressure.  Heart rate and blood pressure.  Body temperature.  Skin for abnormal spots.  What immunizations do I need?    Vaccines are usually given at various ages, according to a schedule. Your health care provider will recommend vaccines for you based on your age, medical history, and lifestyle or other factors, such as travel or where you work.  What tests do I need?  Screening  Your health care provider may recommend screening tests for certain conditions. This may include:  Lipid and cholesterol levels.  Diabetes screening. This is done by checking your blood sugar (glucose) after you have not eaten for a while (fasting).  Pelvic exam and Pap test.  Hepatitis B test.  Hepatitis C  test.  HIV (human immunodeficiency virus) test.  STI (sexually transmitted infection) testing, if you are at risk.  Lung cancer screening.  Colorectal cancer screening.  Mammogram. Talk with your health care provider about when you should start having regular mammograms. This may depend on whether you have a family history of breast cancer.  BRCA-related cancer screening. This may be done if you have a family history of breast, ovarian, tubal, or peritoneal cancers.  Bone density scan. This is done to screen for osteoporosis.  Talk with your health care provider about your test results, treatment options, and if necessary, the need for more tests.  Follow these instructions at home:  Eating and drinking    Eat a diet that includes fresh fruits and vegetables, whole grains, lean protein, and low-fat dairy products.  Take vitamin and mineral supplements as recommended by your health care provider.  Do not drink alcohol if:  Your health care provider tells you not to drink.  You are pregnant, may be pregnant, or are planning to become pregnant.  If you drink alcohol:  Limit how much you have to 0-1 drink a day.  Know how much alcohol is in your drink. In the [place] one drink equals one 12 oz bottle of beer (355 mL), one 5 oz glass of wine (148 mL), or one 1 oz glass of hard liquor (44 mL).  Lifestyle  Brush your teeth every morning and night with fluoride toothpaste. Floss one time each day.  Exercise for at least  30 minutes 5 or more days each week.  Do not use any products that contain nicotine or tobacco. These products include cigarettes, chewing tobacco, and vaping devices, such as e-cigarettes. If you need help quitting, ask your health care provider.  Do not use drugs.  If you are sexually active, practice safe sex. Use a condom or other form of protection to prevent STIs.  If you do not wish to become pregnant, use a form of birth control. If you plan to become pregnant, see your health care provider for a  prepregnancy visit.  Take aspirin only as told by your health care provider. Make sure that you understand how much to take and what form to take. Work with your health care provider to find out whether it is safe and beneficial for you to take aspirin daily.  Find healthy ways to manage stress, such as:  Meditation, yoga, or listening to music.  Journaling.  Talking to a trusted person.  Spending time with friends and family.  Minimize exposure to UV radiation to reduce your risk of skin cancer.  Safety  Always wear your seat belt while driving or riding in a vehicle.  Do not drive:  If you have been drinking alcohol. Do not ride with someone who has been drinking.  When you are tired or distracted.  While texting.  If you have been using any mind-altering substances or drugs.  Wear a helmet and other protective equipment during sports activities.  If you have firearms in your house, make sure you follow all gun safety procedures.  Seek help if you have been physically or sexually abused.  What's next?  Visit your health care provider once a year for an annual wellness visit.  Ask your health care provider how often you should have your eyes and teeth checked.  Stay up to date on all vaccines.  This information is not intended to replace advice given to you by your health care provider. Make sure you discuss any questions you have with your health care provider.  Document Revised: 10/19/2020 Document Reviewed: 10/19/2020  Elsevier Patient Education  2024 [employer].

## 2023-09-05 NOTE — Progress Notes (Signed)
 Established Patient Office Visit  Subjective:  Patient ID: Abigail Wells, female    DOB: 08/29/1960  Age: 63 y.o. MRN: [id number]  CC:  Chief Complaint  Patient presents with   Annual Exam    HPI  Abigail Wells presents Patient presents to the clinic for annual physical exam. Diet: Patient mostly eat chicken, ground beef and park. Patient consumes some fruits but more veggies. Patient eat some fried food. Patient drinks water, coffee and beer. Exercise:  no excerise  Vaccine  Flu: no  Tetanus: due  Shingles:due Pnrumonia: due Colonoscopy: 2023, polys due in 2026 Pap smear/PSA: due Mammogram : due Family history:  Colon cancer: No  Breast/prostate cancer: No  Dentist:due Ophthalmology: due Tobacco use: yes  HPI   Past Medical History:  Diagnosis Date   Hypertension     Past Surgical History:  Procedure Laterality Date   ABDOMINAL HYSTERECTOMY     COLONOSCOPY WITH PROPOFOL  N/A 10/24/2021   Procedure: COLONOSCOPY WITH PROPOFOL ;  Surgeon: Selena Daily, MD;  Location: [facility];  Service: Gastroenterology;  Laterality: N/A;   ESOPHAGOGASTRODUODENOSCOPY (EGD) WITH PROPOFOL  N/A 10/24/2021   Procedure: ESOPHAGOGASTRODUODENOSCOPY (EGD) WITH PROPOFOL ;  Surgeon: Selena Daily, MD;  Location: [facility];  Service: Gastroenterology;  Laterality: N/A;   FLEXIBLE SIGMOIDOSCOPY N/A 03/19/2022   Procedure: FLEXIBLE SIGMOIDOSCOPY;  Surgeon: Selena Daily, MD;  Location: [facility];  Service: Gastroenterology;  Laterality: N/A;    Family History  Problem Relation Age of Onset   Hypercholesterolemia Mother    Hypertension Mother    Stroke Mother    Early death Father    Hypercholesterolemia Sister    Hypertension Sister    Stroke Sister    Hypertension Son    Diabetes Son     Social History   Socioeconomic History   Marital status: Single    Spouse name: Not on file   Number of children: Not on file   Years of education: Not on file    Highest education level: Not on file  Occupational History   Not on file  Tobacco Use   Smoking status: Every Day    Current packs/day: 0.50    Average packs/day: 0.5 packs/day for 38.0 years (19.0 ttl pk-yrs)    Types: Cigarettes   Smokeless tobacco: Not on file  Vaping Use   Vaping status: Every Day  Substance and Sexual Activity   Alcohol use: Yes    Alcohol/week: 5.0 standard drinks of alcohol    Types: 5 Cans of beer per week   Drug use: Never   Sexual activity: Not Currently    Birth control/protection: None  Other Topics Concern   Not on file  Social History Narrative   Not on file   Social Drivers of Health   Financial Resource Strain: Not on file  Food Insecurity: Not on file  Transportation Needs: Not on file  Physical Activity: Not on file  Stress: Not on file  Social Connections: Not on file  Intimate Partner Violence: Not on file     Outpatient Medications Prior to Visit  Medication Sig Dispense Refill   amitriptyline  (ELAVIL ) 25 MG tablet TAKE 1 TABLET BY MOUTH EVERYDAY AT BEDTIME 30 tablet 0   cholecalciferol (VITAMIN D3) 25 MCG (1000 UNIT) tablet Take 1 tablet (1,000 Units total) by mouth daily. 90 tablet 3   ciprofloxacin  (CIPRO ) 250 MG tablet Take 1 tablet (250 mg total) by mouth 2 (two) times daily. 6 tablet 0   valACYclovir  (VALTREX )  1000 MG tablet Take 1 tablet (1,000 mg total) by mouth 2 (two) times daily. 14 tablet 0   losartan -hydrochlorothiazide (HYZAAR) 50-12.5 MG tablet TAKE 1 TABLET BY MOUTH EVERY DAY 90 tablet 0   No facility-administered medications prior to visit.    Allergies  Allergen Reactions   Amoxicillin Swelling   Lisinopril Other (See Comments)    Other Reaction: EXTREME FATIGUE   Penicillins Other (See Comments)    Other Reaction: FACIAL SWELLING    ROS Review of Systems Negative unless indicated in HPI.    Objective:    Physical Exam Exam conducted with a chaperone present.  Constitutional:      Appearance:  Normal appearance. She is normal weight.  HENT:     Head: Normocephalic.     Right Ear: Tympanic membrane normal.     Left Ear: Tympanic membrane normal.     Nose: Nose normal.     Mouth/Throat:     Mouth: Mucous membranes are moist.  Eyes:     Extraocular Movements: Extraocular movements intact.     Conjunctiva/sclera: Conjunctivae normal.     Pupils: Pupils are equal, round, and reactive to light.  Neck:     Thyroid: No thyroid mass or thyroid tenderness.  Cardiovascular:     Rate and Rhythm: Normal rate and regular rhythm.     Pulses: Normal pulses.     Heart sounds: Normal heart sounds. No murmur heard. Pulmonary:     Effort: Pulmonary effort is normal.     Breath sounds: Normal breath sounds. No stridor. No wheezing.  Chest:     Chest wall: No mass.  Breasts:    Right: Normal. No mass or nipple discharge.     Left: Normal. No mass or nipple discharge.  Abdominal:     General: Bowel sounds are normal. There is no distension.     Palpations: Abdomen is soft. There is no mass.     Tenderness: There is no abdominal tenderness. There is no rebound.     Hernia: No hernia is present.  Genitourinary:    Pubic Area: No rash.      Labia:        Right: No rash or lesion.        Left: No rash or lesion.      Vagina: Normal. No vaginal discharge, erythema or lesions.     Cervix: Normal.  Musculoskeletal:     Cervical back: Normal range of motion and neck supple. No tenderness.     Right lower leg: No edema.     Left lower leg: No edema.  Lymphadenopathy:     Upper Body:     Right upper body: No axillary adenopathy.     Left upper body: No axillary adenopathy.  Skin:    General: Skin is warm.     Capillary Refill: Capillary refill takes less than 2 seconds.     Findings: No bruising, erythema or rash.  Neurological:     General: No focal deficit present.     Mental Status: She is alert and oriented to person, place, and time. Mental status is at baseline.     Gait: Gait  abnormal.  Psychiatric:        Mood and Affect: Mood normal.        Behavior: Behavior normal.        Thought Content: Thought content normal.        Judgment: Judgment normal.     BP 126/70   Pulse  85   Temp 97.8 F (36.6 C) (Oral)   Ht 4\' 11"  (1.499 m)   Wt 117 lb 12.8 oz (53.4 kg)   SpO2 99%   BMI 23.79 kg/m  Wt Readings from Last 3 Encounters:  09/05/23 117 lb 12.8 oz (53.4 kg)  08/22/23 116 lb 12.8 oz (53 kg)  08/10/22 123 lb 9.6 oz (56.1 kg)     Health Maintenance  Topic Date Due   HIV Screening  Never done   Hepatitis C Screening  Never done   Pneumococcal Vaccine 70-61 Years old (1 of 2 - PCV) Never done   Zoster Vaccines- Shingrix (1 of 2) Never done   COVID-19 Vaccine (3 - 2024-25 season) 01/06/2023   INFLUENZA VACCINE  12/06/2023   MAMMOGRAM  07/11/2024   Colonoscopy  10/24/2024   Cervical Cancer Screening (HPV/Pap Cotest)  09/04/2028   DTaP/Tdap/Td (2 - Td or Tdap) 09/04/2033   HPV VACCINES  Aged Out   Meningococcal B Vaccine  Aged Out    There are no preventive care reminders to display for this patient.  Lab Results  Component Value Date   TSH 1.69 09/02/2023   Lab Results  Component Value Date   WBC 6.6 09/02/2023   HGB 15.8 (H) 09/02/2023   HCT 46.9 (H) 09/02/2023   MCV 95.2 09/02/2023   PLT 235.0 09/02/2023   Lab Results  Component Value Date   NA 137 09/02/2023   K 4.2 09/02/2023   CO2 24 09/02/2023   GLUCOSE 82 09/02/2023   BUN 6 09/02/2023   CREATININE 0.48 09/02/2023   BILITOT 0.7 09/02/2023   ALKPHOS 79 09/02/2023   AST 21 09/02/2023   ALT 16 09/02/2023   PROT 6.9 09/02/2023   ALBUMIN 4.2 09/02/2023   CALCIUM 9.4 09/02/2023   EGFR 105 06/07/2021   GFR 100.84 09/02/2023   Lab Results  Component Value Date   CHOL 176 09/02/2023   Lab Results  Component Value Date   HDL 60.30 09/02/2023   Lab Results  Component Value Date   LDLCALC 104 (H) 09/02/2023   Lab Results  Component Value Date   TRIG 58.0 09/02/2023    Lab Results  Component Value Date   CHOLHDL 3 09/02/2023   No results found for: "HGBA1C"    Assessment & Plan:  Annual physical exam Assessment & Plan: Encouraged patient to consume a balanced diet and regular exercise regimen. Advised to see an eye doctor and dentist annually.   Mammogram and colonoscopy up-to-date Pap and breast examination performed patient. Tdap vaccine provided. Advised patient to get pneumonia and shingles vaccine at the pharmacy   Primary hypertension -     Losartan  Potassium-HCTZ; Take 1 tablet by mouth daily.  Dispense: 90 tablet; Refill: 0  Cervical cancer screening -     Cytology - PAP  Need for Tdap vaccination -     Tdap vaccine greater than or equal to 7yo IM    Follow-up: No follow-ups on file.   Nashid Pellum, NP

## 2023-09-09 LAB — CYTOLOGY - PAP
Comment: NEGATIVE
Diagnosis: NEGATIVE
High risk HPV: NEGATIVE

## 2023-09-11 ENCOUNTER — Other Ambulatory Visit: Payer: Self-pay | Admitting: Gastroenterology

## 2023-09-16 DIAGNOSIS — Z0000 Encounter for general adult medical examination without abnormal findings: Secondary | ICD-10-CM | POA: Insufficient documentation

## 2023-09-16 MED ORDER — LOSARTAN POTASSIUM-HCTZ 50-12.5 MG PO TABS: 1.0000 | ORAL_TABLET | Freq: Every day | ORAL | 0 refills | Status: DC

## 2023-09-16 NOTE — Assessment & Plan Note (Signed)
 Encouraged patient to consume a balanced diet and regular exercise regimen. Advised to see an eye doctor and dentist annually.   Mammogram and colonoscopy up-to-date Pap and breast examination performed patient. Tdap vaccine provided. Advised patient to get pneumonia and shingles vaccine at the pharmacy

## 2023-11-28 ENCOUNTER — Telehealth: Payer: Self-pay

## 2023-11-28 NOTE — Telephone Encounter (Signed)
 Copied from CRM [id number]. Topic: General - Other >> Nov 28, 2023  3:53 PM Deleta S wrote: Reason for CRM: patient has handicap sticker she would like for the sticker to be permeant instead of 3 month. She wants to know if this can be mailed as well. Please give the patient a call regarding updates at [phone]

## 2023-11-29 NOTE — Telephone Encounter (Signed)
 Filled out handicap parking form and gave to [employer] to sign.

## 2023-11-29 NOTE — Telephone Encounter (Signed)
Okay to fill it.

## 2023-12-02 NOTE — Telephone Encounter (Signed)
 Called Patient to let her know that the handicap parking form is ready for pick up and will be in the front office for pick up.

## 2023-12-02 NOTE — Telephone Encounter (Signed)
 Pt came in to pick up form this afternoon. [facility]

## 2024-01-08 ENCOUNTER — Other Ambulatory Visit: Payer: Self-pay | Admitting: Nurse Practitioner

## 2024-01-08 DIAGNOSIS — I10 Essential (primary) hypertension: Secondary | ICD-10-CM

## 2024-04-15 ENCOUNTER — Other Ambulatory Visit: Payer: Self-pay | Admitting: Nurse Practitioner

## 2024-04-15 DIAGNOSIS — I10 Essential (primary) hypertension: Secondary | ICD-10-CM
